# Patient Record
Sex: Male | Born: 1955 | Race: White | Hispanic: No | Marital: Married | Smoking: Former smoker
Health system: Southern US, Community
[De-identification: ages and names within clinical notes are randomized; demographics above are authoritative.]

## PROBLEM LIST (undated history)

## (undated) DIAGNOSIS — I700 Atherosclerosis of aorta: Secondary | ICD-10-CM

## (undated) DIAGNOSIS — K589 Irritable bowel syndrome without diarrhea: Secondary | ICD-10-CM

## (undated) DIAGNOSIS — C61 Malignant neoplasm of prostate: Secondary | ICD-10-CM

## (undated) DIAGNOSIS — K4090 Unilateral inguinal hernia, without obstruction or gangrene, not specified as recurrent: Secondary | ICD-10-CM

## (undated) DIAGNOSIS — K5790 Diverticulosis of intestine, part unspecified, without perforation or abscess without bleeding: Secondary | ICD-10-CM

## (undated) DIAGNOSIS — K219 Gastro-esophageal reflux disease without esophagitis: Secondary | ICD-10-CM

## (undated) DIAGNOSIS — R972 Elevated prostate specific antigen [PSA]: Secondary | ICD-10-CM

## (undated) DIAGNOSIS — R040 Epistaxis: Secondary | ICD-10-CM

## (undated) DIAGNOSIS — I10 Essential (primary) hypertension: Secondary | ICD-10-CM

## (undated) HISTORY — PX: COLONOSCOPY: SHX174

## (undated) HISTORY — PX: UPPER GI ENDOSCOPY: SHX6162

## (undated) HISTORY — PX: CATARACT EXTRACTION: SUR2

## (undated) HISTORY — PX: WISDOM TOOTH EXTRACTION: SHX21

---

## 2017-02-22 ENCOUNTER — Other Ambulatory Visit: Payer: Self-pay | Admitting: Gastroenterology

## 2017-02-22 DIAGNOSIS — R1012 Left upper quadrant pain: Secondary | ICD-10-CM

## 2017-02-24 ENCOUNTER — Ambulatory Visit
Admission: RE | Admit: 2017-02-24 | Discharge: 2017-02-24 | Disposition: A | Discharge status: Home / Self Care | Payer: Managed Care, Other (non HMO) | Source: Ambulatory Visit | Attending: Gastroenterology | Admitting: Gastroenterology

## 2017-02-24 DIAGNOSIS — I700 Atherosclerosis of aorta: Secondary | ICD-10-CM | POA: Insufficient documentation

## 2017-02-24 DIAGNOSIS — K5730 Diverticulosis of large intestine without perforation or abscess without bleeding: Secondary | ICD-10-CM | POA: Insufficient documentation

## 2017-02-24 DIAGNOSIS — R1012 Left upper quadrant pain: Secondary | ICD-10-CM

## 2017-02-24 MED ORDER — IOPAMIDOL (ISOVUE-300) INJECTION 61%
100.0000 mL | Freq: Once | INTRAVENOUS | Status: AC | PRN
  Administered 2017-02-24: 100 mL via INTRAVENOUS

## 2017-11-02 ENCOUNTER — Other Ambulatory Visit: Payer: Self-pay | Admitting: Urology

## 2017-11-02 DIAGNOSIS — C61 Malignant neoplasm of prostate: Secondary | ICD-10-CM

## 2017-11-17 ENCOUNTER — Encounter (HOSPITAL_COMMUNITY)
Admission: RE | Admit: 2017-11-17 | Discharge: 2017-11-17 | Disposition: A | Discharge status: Home / Self Care | Payer: Managed Care, Other (non HMO) | Source: Ambulatory Visit | Attending: Urology | Admitting: Urology

## 2017-11-17 DIAGNOSIS — C61 Malignant neoplasm of prostate: Secondary | ICD-10-CM | POA: Insufficient documentation

## 2017-11-17 MED ORDER — TECHNETIUM TC 99M MEDRONATE IV KIT
21.3000 | PACK | Freq: Once | INTRAVENOUS | Status: AC | PRN
  Administered 2017-11-17: 21.3 via INTRAVENOUS

## 2017-11-23 ENCOUNTER — Ambulatory Visit (HOSPITAL_COMMUNITY)
Admission: RE | Admit: 2017-11-23 | Discharge: 2017-11-23 | Disposition: A | Discharge status: Home / Self Care | Payer: Managed Care, Other (non HMO) | Source: Ambulatory Visit | Attending: Urology | Admitting: Urology

## 2017-11-23 ENCOUNTER — Other Ambulatory Visit: Payer: Self-pay | Admitting: Urology

## 2017-11-23 DIAGNOSIS — I700 Atherosclerosis of aorta: Secondary | ICD-10-CM | POA: Insufficient documentation

## 2017-11-23 DIAGNOSIS — C61 Malignant neoplasm of prostate: Secondary | ICD-10-CM | POA: Diagnosis not present

## 2017-12-26 ENCOUNTER — Encounter (HOSPITAL_COMMUNITY): Payer: Self-pay

## 2017-12-26 NOTE — Patient Instructions (Signed)
Your procedure is scheduled on: Monday, January 01, 2018   Surgery Time:  12:30PM-4:30PM   Report to [facility] Main  Entrance    Report to admitting at 10:30 AM   Call this number if you have problems the morning of surgery [phone]   Do not eat food or drink liquids :After Midnight.   Do NOT smoke after Midnight   Take these medicines the morning of surgery with A SIP OF WATER: Amlodipine                               You may not have any metal on your body including  jewelry, and body piercings             Do not wear lotions, powders, perfumes/cologne, or deodorant                          Men may shave face and neck.   Do not bring valuables to the hospital. [place].   Contacts, dentures or bridgework may not be worn into surgery.   Leave suitcase in the car. After surgery it may be brought to your room.   Special Instructions: Bring a copy of your healthcare power of attorney and living will documents         the day of surgery if you haven't scanned them in before.              Please read over the following fact sheets you were given:  [facility] - Preparing for Surgery Before surgery, you can play an important role.  Because skin is not sterile, your skin needs to be as free of germs as possible.  You can reduce the number of germs on your skin by washing with CHG (chlorahexidine gluconate) soap before surgery.  CHG is an antiseptic cleaner which kills germs and bonds with the skin to continue killing germs even after washing. Please DO NOT use if you have an allergy to CHG or antibacterial soaps.  If your skin becomes reddened/irritated stop using the CHG and inform your nurse when you arrive at Short Stay. Do not shave (including legs and underarms) for at least 48 hours prior to the first CHG shower.  You may shave your face/neck.  Please follow these instructions carefully:  1.  Shower with CHG  Soap the night before surgery and the  morning of surgery.  2.  If you choose to wash your hair, wash your hair first as usual with your normal  shampoo.  3.  After you shampoo, rinse your hair and body thoroughly to remove the shampoo.                             4.  Use CHG as you would any other liquid soap.  You can apply chg directly to the skin and wash.  Gently with a scrungie or clean washcloth.  5.  Apply the CHG Soap to your body ONLY FROM THE NECK DOWN.   Do   not use on face/ open                           Wound or open  sores. Avoid contact with eyes, ears mouth and   genitals (private parts).                       Wash face,  Genitals (private parts) with your normal soap.             6.  Wash thoroughly, paying special attention to the area where your    surgery  will be performed.  7.  Thoroughly rinse your body with warm water from the neck down.  8.  DO NOT shower/wash with your normal soap after using and rinsing off the CHG Soap.                9.  Pat yourself dry with a clean towel.            10.  Wear clean pajamas.            11.  Place clean sheets on your bed the night of your first shower and do not  sleep with pets. Day of Surgery : Do not apply any lotions/deodorants the morning of surgery.  Please wear clean clothes to the hospital/surgery center.  FAILURE TO FOLLOW THESE INSTRUCTIONS MAY RESULT IN THE CANCELLATION OF YOUR SURGERY  PATIENT SIGNATURE_________________________________  NURSE SIGNATURE__________________________________  ________________________________________________________________________  WHAT IS A BLOOD TRANSFUSION? Blood Transfusion Information  A transfusion is the replacement of blood or some of its parts. Blood is made up of multiple cells which provide different functions.  Red blood cells carry oxygen and are used for blood loss replacement.  White blood cells fight against infection.  Platelets control bleeding.  Plasma helps  clot blood.  Other blood products are available for specialized needs, such as hemophilia or other clotting disorders. BEFORE THE TRANSFUSION  Who gives blood for transfusions?   Healthy volunteers who are fully evaluated to make sure their blood is safe. This is blood bank blood. Transfusion therapy is the safest it has ever been in the practice of medicine. Before blood is taken from a donor, a complete history is taken to make sure that person has no history of diseases nor engages in risky social behavior (examples are intravenous drug use or sexual activity with multiple partners). The donor's travel history is screened to minimize risk of transmitting infections, such as malaria. The donated blood is tested for signs of infectious diseases, such as HIV and hepatitis. The blood is then tested to be sure it is compatible with you in order to minimize the chance of a transfusion reaction. If you or a relative donates blood, this is often done in anticipation of surgery and is not appropriate for emergency situations. It takes many days to process the donated blood. RISKS AND COMPLICATIONS Although transfusion therapy is very safe and saves many lives, the main dangers of transfusion include:   Getting an infectious disease.  Developing a transfusion reaction. This is an allergic reaction to something in the blood you were given. Every precaution is taken to prevent this. The decision to have a blood transfusion has been considered carefully by your caregiver before blood is given. Blood is not given unless the benefits outweigh the risks. AFTER THE TRANSFUSION  Right after receiving a blood transfusion, you will usually feel much better and more energetic. This is especially true if your red blood cells have gotten low (anemic). The transfusion raises the level of the red blood cells which carry oxygen, and this usually causes an energy increase.  The nurse administering the transfusion will  monitor you carefully for complications. HOME CARE INSTRUCTIONS  No special instructions are needed after a transfusion. You may find your energy is better. Speak with your caregiver about any limitations on activity for underlying diseases you may have. SEEK MEDICAL CARE IF:   Your condition is not improving after your transfusion.  You develop redness or irritation at the intravenous (IV) site. SEEK IMMEDIATE MEDICAL CARE IF:  Any of the following symptoms occur over the next 12 hours:  Shaking chills.  You have a temperature by mouth above 102 F (38.9 C), not controlled by medicine.  Chest, back, or muscle pain.  People around you feel you are not acting correctly or are confused.  Shortness of breath or difficulty breathing.  Dizziness and fainting.  You get a rash or develop hives.  You have a decrease in urine output.  Your urine turns a dark color or changes to pink, red, or brown. Any of the following symptoms occur over the next 10 days:  You have a temperature by mouth above 102 F (38.9 C), not controlled by medicine.  Shortness of breath.  Weakness after normal activity.  The white part of the eye turns yellow (jaundice).  You have a decrease in the amount of urine or are urinating less often.  Your urine turns a dark color or changes to pink, red, or brown. Document Released: 09/23/2000 Document Revised: 12/19/2011 Document Reviewed: 05/12/2008 [facility] Patient Information 2014 [place], [place].  _______________________________________________________________________

## 2017-12-26 NOTE — Pre-Procedure Instructions (Signed)
Last office note from Dr. Heber [place] in epic. EKG 11/01/2017 in patient's hard chart

## 2017-12-27 ENCOUNTER — Encounter (HOSPITAL_COMMUNITY)
Admission: RE | Admit: 2017-12-27 | Discharge: 2017-12-27 | Disposition: A | Discharge status: Home / Self Care | Payer: Managed Care, Other (non HMO) | Source: Ambulatory Visit | Attending: Urology | Admitting: Urology

## 2017-12-27 ENCOUNTER — Other Ambulatory Visit: Payer: Self-pay

## 2017-12-27 ENCOUNTER — Encounter (HOSPITAL_COMMUNITY): Payer: Self-pay

## 2017-12-27 DIAGNOSIS — Z01812 Encounter for preprocedural laboratory examination: Secondary | ICD-10-CM | POA: Diagnosis present

## 2017-12-27 DIAGNOSIS — C61 Malignant neoplasm of prostate: Secondary | ICD-10-CM | POA: Diagnosis not present

## 2017-12-27 HISTORY — DX: Diverticulosis of intestine, part unspecified, without perforation or abscess without bleeding: K57.90

## 2017-12-27 HISTORY — DX: Malignant neoplasm of prostate: C61

## 2017-12-27 HISTORY — DX: Atherosclerosis of aorta: I70.0

## 2017-12-27 HISTORY — DX: Essential (primary) hypertension: I10

## 2017-12-27 HISTORY — DX: Epistaxis: R04.0

## 2017-12-27 HISTORY — DX: Unilateral inguinal hernia, without obstruction or gangrene, not specified as recurrent: K40.90

## 2017-12-27 LAB — PROTIME-INR
INR: 0.88
Prothrombin Time: 11.8 seconds (ref 11.4–15.2)

## 2017-12-27 LAB — BASIC METABOLIC PANEL
Anion gap: 12 (ref 5–15)
BUN: 5 mg/dL — ABNORMAL LOW (ref 6–20)
CHLORIDE: 102 mmol/L (ref 101–111)
CO2: 24 mmol/L (ref 22–32)
CREATININE: 0.6 mg/dL — AB (ref 0.61–1.24)
Calcium: 9.1 mg/dL (ref 8.9–10.3)
GFR calc Af Amer: >60 mL/min (ref >60)
GFR calc non Af Amer: >60 mL/min (ref >60)
Glucose, Bld: 114 mg/dL — ABNORMAL HIGH (ref 65–99)
POTASSIUM: 3 mmol/L — AB (ref 3.5–5.1)
Sodium: 138 mmol/L (ref 135–145)

## 2017-12-27 LAB — CBC
HEMATOCRIT: 38.6 % — AB (ref 39.0–52.0)
HEMOGLOBIN: 13.3 g/dL (ref 13.0–17.0)
MCH: 34.9 pg — AB (ref 26.0–34.0)
MCHC: 34.5 g/dL (ref 30.0–36.0)
MCV: 101.3 fL — AB (ref 78.0–100.0)
PLATELETS: 278 10*3/uL (ref 150–400)
RBC: 3.81 MIL/uL — AB (ref 4.22–5.81)
RDW: 14.4 % (ref 11.5–15.5)
WBC: 11.1 10*3/uL — ABNORMAL HIGH (ref 4.0–10.5)

## 2017-12-27 LAB — ABO/RH: ABO/RH(D): A POS

## 2017-12-27 NOTE — Pre-Procedure Instructions (Addendum)
Dr. Ola Spurr made aware of potassium 3.0 on 12/27/2017, no new orders received at this time.  CBC and BMP results 12/27/2017 faxed to Dr. Gloriann Loan via epic.

## 2017-12-27 NOTE — Patient Instructions (Signed)
Your procedure is scheduled on: Monday, January 01, 2018   Surgery Time:  12:30PM-4:30PM   Report to [facility] Main  Entrance    Report to admitting at 10:30 AM   Call this number if you have problems the morning of surgery [phone]   Do not eat food or drink liquids :After Midnight.   Do NOT smoke after Midnight   Take these medicines the morning of surgery with A SIP OF WATER: Amlodipine                               You may not have any metal on your body including  jewelry, and body piercings             Do not wear  lotions, powders, perfumes/cologne, or deodorant                          Men may shave face and neck.   Do not bring valuables to the hospital. [place].   Contacts, dentures or bridgework may not be worn into surgery.   Leave suitcase in the car. After surgery it may be brought to your room.   Special Instructions: Bring a copy of your healthcare power of attorney and living will documents         the day of surgery if you haven't scanned them in before.              Please read over the following fact sheets you were given:  [facility] - Preparing for Surgery Before surgery, you can play an important role.  Because skin is not sterile, your skin needs to be as free of germs as possible.  You can reduce the number of germs on your skin by washing with CHG (chlorahexidine gluconate) soap before surgery.  CHG is an antiseptic cleaner which kills germs and bonds with the skin to continue killing germs even after washing. Please DO NOT use if you have an allergy to CHG or antibacterial soaps.  If your skin becomes reddened/irritated stop using the CHG and inform your nurse when you arrive at Short Stay. Do not shave (including legs and underarms) for at least 48 hours prior to the first CHG shower.  You may shave your face/neck.  Please follow these instructions carefully:  1.  Shower with CHG  Soap the night before surgery and the  morning of surgery.  2.  If you choose to wash your hair, wash your hair first as usual with your normal  shampoo.  3.  After you shampoo, rinse your hair and body thoroughly to remove the shampoo.                             4.  Use CHG as you would any other liquid soap.  You can apply chg directly to the skin and wash.  Gently with a scrungie or clean washcloth.  5.  Apply the CHG Soap to your body ONLY FROM THE NECK DOWN.   Do   not use on face/ open                           Wound or  open sores. Avoid contact with eyes, ears mouth and   genitals (private parts).                       Wash face,  Genitals (private parts) with your normal soap.             6.  Wash thoroughly, paying special attention to the area where your    surgery  will be performed.  7.  Thoroughly rinse your body with warm water from the neck down.  8.  DO NOT shower/wash with your normal soap after using and rinsing off the CHG Soap.                9.  Pat yourself dry with a clean towel.            10.  Wear clean pajamas.            11.  Place clean sheets on your bed the night of your first shower and do not  sleep with pets. Day of Surgery : Do not apply any lotions/deodorants the morning of surgery.  Please wear clean clothes to the hospital/surgery center.  FAILURE TO FOLLOW THESE INSTRUCTIONS MAY RESULT IN THE CANCELLATION OF YOUR SURGERY  PATIENT SIGNATURE_________________________________  NURSE SIGNATURE__________________________________  ________________________________________________________________________  WHAT IS A BLOOD TRANSFUSION? Blood Transfusion Information  A transfusion is the replacement of blood or some of its parts. Blood is made up of multiple cells which provide different functions.  Red blood cells carry oxygen and are used for blood loss replacement.  White blood cells fight against infection.  Platelets control bleeding.  Plasma helps  clot blood.  Other blood products are available for specialized needs, such as hemophilia or other clotting disorders. BEFORE THE TRANSFUSION  Who gives blood for transfusions?   Healthy volunteers who are fully evaluated to make sure their blood is safe. This is blood bank blood. Transfusion therapy is the safest it has ever been in the practice of medicine. Before blood is taken from a donor, a complete history is taken to make sure that person has no history of diseases nor engages in risky social behavior (examples are intravenous drug use or sexual activity with multiple partners). The donor's travel history is screened to minimize risk of transmitting infections, such as malaria. The donated blood is tested for signs of infectious diseases, such as HIV and hepatitis. The blood is then tested to be sure it is compatible with you in order to minimize the chance of a transfusion reaction. If you or a relative donates blood, this is often done in anticipation of surgery and is not appropriate for emergency situations. It takes many days to process the donated blood. RISKS AND COMPLICATIONS Although transfusion therapy is very safe and saves many lives, the main dangers of transfusion include:   Getting an infectious disease.  Developing a transfusion reaction. This is an allergic reaction to something in the blood you were given. Every precaution is taken to prevent this. The decision to have a blood transfusion has been considered carefully by your caregiver before blood is given. Blood is not given unless the benefits outweigh the risks. AFTER THE TRANSFUSION  Right after receiving a blood transfusion, you will usually feel much better and more energetic. This is especially true if your red blood cells have gotten low (anemic). The transfusion raises the level of the red blood cells which carry oxygen, and this usually causes an energy  increase.  The nurse administering the transfusion will  monitor you carefully for complications. HOME CARE INSTRUCTIONS  No special instructions are needed after a transfusion. You may find your energy is better. Speak with your caregiver about any limitations on activity for underlying diseases you may have. SEEK MEDICAL CARE IF:   Your condition is not improving after your transfusion.  You develop redness or irritation at the intravenous (IV) site. SEEK IMMEDIATE MEDICAL CARE IF:  Any of the following symptoms occur over the next 12 hours:  Shaking chills.  You have a temperature by mouth above 102 F (38.9 C), not controlled by medicine.  Chest, back, or muscle pain.  People around you feel you are not acting correctly or are confused.  Shortness of breath or difficulty breathing.  Dizziness and fainting.  You get a rash or develop hives.  You have a decrease in urine output.  Your urine turns a dark color or changes to pink, red, or brown. Any of the following symptoms occur over the next 10 days:  You have a temperature by mouth above 102 F (38.9 C), not controlled by medicine.  Shortness of breath.  Weakness after normal activity.  The white part of the eye turns yellow (jaundice).  You have a decrease in the amount of urine or are urinating less often.  Your urine turns a dark color or changes to pink, red, or brown. Document Released: 09/23/2000 Document Revised: 12/19/2011 Document Reviewed: 05/12/2008 [facility] Patient Information 2014 [place], [place].  _______________________________________________________________________

## 2018-01-01 ENCOUNTER — Encounter (HOSPITAL_COMMUNITY)
Admission: RE | Disposition: A | Discharge status: Home / Self Care | Payer: Self-pay | Source: Ambulatory Visit | Attending: Urology

## 2018-01-01 ENCOUNTER — Encounter (HOSPITAL_COMMUNITY): Payer: Self-pay | Admitting: Certified Registered Nurse Anesthetist

## 2018-01-01 ENCOUNTER — Observation Stay (HOSPITAL_COMMUNITY)
Admission: RE | Admit: 2018-01-01 | Discharge: 2018-01-02 | Disposition: A | Discharge status: Home / Self Care | Payer: Managed Care, Other (non HMO) | Source: Ambulatory Visit | Attending: Urology | Admitting: Urology

## 2018-01-01 ENCOUNTER — Ambulatory Visit (HOSPITAL_COMMUNITY): Payer: Managed Care, Other (non HMO) | Admitting: Anesthesiology

## 2018-01-01 ENCOUNTER — Other Ambulatory Visit: Payer: Self-pay

## 2018-01-01 DIAGNOSIS — I700 Atherosclerosis of aorta: Secondary | ICD-10-CM | POA: Diagnosis not present

## 2018-01-01 DIAGNOSIS — Z8719 Personal history of other diseases of the digestive system: Secondary | ICD-10-CM | POA: Insufficient documentation

## 2018-01-01 DIAGNOSIS — Z79899 Other long term (current) drug therapy: Secondary | ICD-10-CM | POA: Insufficient documentation

## 2018-01-01 DIAGNOSIS — K589 Irritable bowel syndrome without diarrhea: Secondary | ICD-10-CM | POA: Insufficient documentation

## 2018-01-01 DIAGNOSIS — Z888 Allergy status to other drugs, medicaments and biological substances status: Secondary | ICD-10-CM | POA: Insufficient documentation

## 2018-01-01 DIAGNOSIS — C61 Malignant neoplasm of prostate: Secondary | ICD-10-CM | POA: Diagnosis not present

## 2018-01-01 DIAGNOSIS — I10 Essential (primary) hypertension: Secondary | ICD-10-CM | POA: Insufficient documentation

## 2018-01-01 DIAGNOSIS — F17210 Nicotine dependence, cigarettes, uncomplicated: Secondary | ICD-10-CM | POA: Diagnosis not present

## 2018-01-01 DIAGNOSIS — K219 Gastro-esophageal reflux disease without esophagitis: Secondary | ICD-10-CM | POA: Insufficient documentation

## 2018-01-01 HISTORY — DX: Irritable bowel syndrome without diarrhea: K58.9

## 2018-01-01 HISTORY — DX: Irritable bowel syndrome, unspecified: K58.9

## 2018-01-01 HISTORY — PX: ROBOT ASSISTED LAPAROSCOPIC RADICAL PROSTATECTOMY: SHX5141

## 2018-01-01 HISTORY — DX: Gastro-esophageal reflux disease without esophagitis: K21.9

## 2018-01-01 HISTORY — PX: LYMPHADENECTOMY: SHX5960

## 2018-01-01 LAB — CBC
HCT: 39.6 % (ref 39.0–52.0)
HEMOGLOBIN: 13.2 g/dL (ref 13.0–17.0)
MCH: 35.2 pg — AB (ref 26.0–34.0)
MCHC: 33.3 g/dL (ref 30.0–36.0)
MCV: 105.6 fL — ABNORMAL HIGH (ref 78.0–100.0)
PLATELETS: 302 10*3/uL (ref 150–400)
RBC: 3.75 MIL/uL — AB (ref 4.22–5.81)
RDW: 14.8 % (ref 11.5–15.5)
WBC: 16.5 10*3/uL — ABNORMAL HIGH (ref 4.0–10.5)

## 2018-01-01 LAB — TYPE AND SCREEN
ABO/RH(D): A POS
ANTIBODY SCREEN: NEGATIVE

## 2018-01-01 LAB — CREATININE, SERUM
CREATININE: 0.69 mg/dL (ref 0.61–1.24)
GFR calc Af Amer: >60 mL/min (ref >60)

## 2018-01-01 SURGERY — PROSTATECTOMY, RADICAL, ROBOT-ASSISTED, LAPAROSCOPIC
Anesthesia: General

## 2018-01-01 MED ORDER — MIDAZOLAM HCL 5 MG/5ML IJ SOLN
INTRAMUSCULAR | Status: DC | PRN
  Administered 2018-01-01: 2 mg via INTRAVENOUS

## 2018-01-01 MED ORDER — ACETAMINOPHEN 325 MG PO TABS: 650.0000 mg | ORAL_TABLET | ORAL | Status: DC | PRN

## 2018-01-01 MED ORDER — ROCURONIUM BROMIDE 10 MG/ML (PF) SYRINGE
PREFILLED_SYRINGE | INTRAVENOUS | Status: AC
  Filled 2018-01-01: qty 5

## 2018-01-01 MED ORDER — OXYCODONE HCL 5 MG PO TABS: 5.0000 mg | ORAL_TABLET | Freq: Once | ORAL | Status: DC | PRN

## 2018-01-01 MED ORDER — HYDROMORPHONE HCL 1 MG/ML IJ SOLN
INTRAMUSCULAR | Status: AC
  Filled 2018-01-01: qty 1

## 2018-01-01 MED ORDER — ACETAMINOPHEN 10 MG/ML IV SOLN
INTRAVENOUS | Status: AC
  Filled 2018-01-01: qty 100

## 2018-01-01 MED ORDER — HYDROMORPHONE HCL 1 MG/ML IJ SOLN
0.5000 mg | INTRAMUSCULAR | Status: DC | PRN
  Administered 2018-01-01: 0.5 mg via INTRAVENOUS
  Administered 2018-01-02: 1 mg via INTRAVENOUS
  Filled 2018-01-01 (×2): qty 1

## 2018-01-01 MED ORDER — FENTANYL CITRATE (PF) 100 MCG/2ML IJ SOLN
INTRAMUSCULAR | Status: AC
  Filled 2018-01-01: qty 2

## 2018-01-01 MED ORDER — PROPOFOL 10 MG/ML IV BOLUS
INTRAVENOUS | Status: DC | PRN
  Administered 2018-01-01: 150 mg via INTRAVENOUS

## 2018-01-01 MED ORDER — ONDANSETRON HCL 4 MG/2ML IJ SOLN
INTRAMUSCULAR | Status: AC
  Filled 2018-01-01: qty 2

## 2018-01-01 MED ORDER — BUPIVACAINE LIPOSOME 1.3 % IJ SUSP
20.0000 mL | Freq: Once | INTRAMUSCULAR | Status: AC
  Administered 2018-01-01: 20 mL
  Filled 2018-01-01: qty 20

## 2018-01-01 MED ORDER — CEFAZOLIN SODIUM-DEXTROSE 2-4 GM/100ML-% IV SOLN
2.0000 g | INTRAVENOUS | Status: AC
  Administered 2018-01-01: 2 g via INTRAVENOUS
  Filled 2018-01-01: qty 100

## 2018-01-01 MED ORDER — ACETAMINOPHEN 10 MG/ML IV SOLN
1000.0000 mg | Freq: Once | INTRAVENOUS | Status: AC
  Administered 2018-01-01: 1000 mg via INTRAVENOUS

## 2018-01-01 MED ORDER — OXYCODONE HCL 5 MG/5ML PO SOLN
5.0000 mg | Freq: Once | ORAL | Status: DC | PRN
  Filled 2018-01-01: qty 5

## 2018-01-01 MED ORDER — ONDANSETRON HCL 4 MG/2ML IJ SOLN: 4.0000 mg | INTRAMUSCULAR | Status: DC | PRN

## 2018-01-01 MED ORDER — MIDAZOLAM HCL 2 MG/2ML IJ SOLN
INTRAMUSCULAR | Status: AC
  Filled 2018-01-01: qty 2

## 2018-01-01 MED ORDER — AMLODIPINE BESYLATE 5 MG PO TABS
5.0000 mg | ORAL_TABLET | Freq: Every day | ORAL | Status: DC
  Administered 2018-01-01 – 2018-01-02 (×2): 5 mg via ORAL
  Filled 2018-01-01 (×2): qty 1

## 2018-01-01 MED ORDER — DEXTROSE-NACL 5-0.45 % IV SOLN
INTRAVENOUS | Status: DC
  Administered 2018-01-01 – 2018-01-02 (×2): via INTRAVENOUS

## 2018-01-01 MED ORDER — LACTATED RINGERS IR SOLN
Status: DC | PRN
  Administered 2018-01-01: 1

## 2018-01-01 MED ORDER — SODIUM CHLORIDE 0.9 % IV BOLUS: 1000.0000 mL | Freq: Once | INTRAVENOUS | Status: DC

## 2018-01-01 MED ORDER — ONDANSETRON HCL 4 MG/2ML IJ SOLN
INTRAMUSCULAR | Status: DC | PRN
  Administered 2018-01-01: 4 mg via INTRAVENOUS

## 2018-01-01 MED ORDER — LIDOCAINE 2% (20 MG/ML) 5 ML SYRINGE
INTRAMUSCULAR | Status: DC | PRN
  Administered 2018-01-01: 60 mg via INTRAVENOUS

## 2018-01-01 MED ORDER — DIPHENHYDRAMINE HCL 50 MG/ML IJ SOLN: 12.5000 mg | Freq: Four times a day (QID) | INTRAMUSCULAR | Status: DC | PRN

## 2018-01-01 MED ORDER — ACETAMINOPHEN 325 MG PO TABS: 325.0000 mg | ORAL_TABLET | ORAL | Status: DC | PRN

## 2018-01-01 MED ORDER — SUGAMMADEX SODIUM 200 MG/2ML IV SOLN
INTRAVENOUS | Status: DC | PRN
  Administered 2018-01-01: 200 mg via INTRAVENOUS

## 2018-01-01 MED ORDER — ENOXAPARIN SODIUM 40 MG/0.4ML ~~LOC~~ SOLN
40.0000 mg | SUBCUTANEOUS | Status: DC
  Administered 2018-01-02: 40 mg via SUBCUTANEOUS
  Filled 2018-01-01: qty 0.4

## 2018-01-01 MED ORDER — ONDANSETRON HCL 4 MG/2ML IJ SOLN: 4.0000 mg | Freq: Once | INTRAMUSCULAR | Status: DC | PRN

## 2018-01-01 MED ORDER — LIDOCAINE 2% (20 MG/ML) 5 ML SYRINGE
INTRAMUSCULAR | Status: AC
  Filled 2018-01-01: qty 5

## 2018-01-01 MED ORDER — EPHEDRINE SULFATE-NACL 50-0.9 MG/10ML-% IV SOSY
PREFILLED_SYRINGE | INTRAVENOUS | Status: DC | PRN
  Administered 2018-01-01 (×4): 5 mg via INTRAVENOUS

## 2018-01-01 MED ORDER — FENTANYL CITRATE (PF) 250 MCG/5ML IJ SOLN
INTRAMUSCULAR | Status: AC
  Filled 2018-01-01: qty 5

## 2018-01-01 MED ORDER — HYDROMORPHONE HCL 1 MG/ML IJ SOLN
0.2500 mg | INTRAMUSCULAR | Status: DC | PRN
  Administered 2018-01-01: 0.5 mg via INTRAVENOUS

## 2018-01-01 MED ORDER — SUGAMMADEX SODIUM 200 MG/2ML IV SOLN
INTRAVENOUS | Status: AC
  Filled 2018-01-01: qty 2

## 2018-01-01 MED ORDER — ACETAMINOPHEN 160 MG/5ML PO SOLN: 325.0000 mg | ORAL | Status: DC | PRN

## 2018-01-01 MED ORDER — SODIUM CHLORIDE 0.9 % IJ SOLN
INTRAMUSCULAR | Status: DC | PRN
  Administered 2018-01-01 (×2): 20 mL

## 2018-01-01 MED ORDER — FENTANYL CITRATE (PF) 100 MCG/2ML IJ SOLN
INTRAMUSCULAR | Status: DC | PRN
  Administered 2018-01-01: 100 ug via INTRAVENOUS
  Administered 2018-01-01 (×3): 50 ug via INTRAVENOUS
  Administered 2018-01-01 (×2): 100 ug via INTRAVENOUS

## 2018-01-01 MED ORDER — ROCURONIUM BROMIDE 10 MG/ML (PF) SYRINGE
PREFILLED_SYRINGE | INTRAVENOUS | Status: DC | PRN
  Administered 2018-01-01: 10 mg via INTRAVENOUS
  Administered 2018-01-01: 20 mg via INTRAVENOUS
  Administered 2018-01-01: 60 mg via INTRAVENOUS

## 2018-01-01 MED ORDER — LISINOPRIL 10 MG PO TABS
10.0000 mg | ORAL_TABLET | Freq: Every day | ORAL | Status: DC
  Administered 2018-01-01 – 2018-01-02 (×2): 10 mg via ORAL
  Filled 2018-01-01 (×2): qty 1

## 2018-01-01 MED ORDER — HYDROCODONE-ACETAMINOPHEN 5-325 MG PO TABS: 1.0000 | ORAL_TABLET | Freq: Four times a day (QID) | ORAL | 0 refills | Status: DC | PRN

## 2018-01-01 MED ORDER — PANTOPRAZOLE SODIUM 40 MG PO TBEC
80.0000 mg | DELAYED_RELEASE_TABLET | Freq: Every day | ORAL | Status: DC
  Administered 2018-01-02: 80 mg via ORAL
  Filled 2018-01-01: qty 2

## 2018-01-01 MED ORDER — DIPHENHYDRAMINE HCL 12.5 MG/5ML PO ELIX: 12.5000 mg | ORAL_SOLUTION | Freq: Four times a day (QID) | ORAL | Status: DC | PRN

## 2018-01-01 MED ORDER — LIP MEDEX EX OINT
TOPICAL_OINTMENT | CUTANEOUS | Status: AC
  Filled 2018-01-01: qty 7

## 2018-01-01 MED ORDER — KETOROLAC TROMETHAMINE 30 MG/ML IJ SOLN
INTRAMUSCULAR | Status: AC
  Filled 2018-01-01: qty 1

## 2018-01-01 MED ORDER — DEXAMETHASONE SODIUM PHOSPHATE 10 MG/ML IJ SOLN
INTRAMUSCULAR | Status: AC
  Filled 2018-01-01: qty 1

## 2018-01-01 MED ORDER — KETOROLAC TROMETHAMINE 30 MG/ML IJ SOLN
30.0000 mg | Freq: Once | INTRAMUSCULAR | Status: DC | PRN
  Administered 2018-01-01: 30 mg via INTRAVENOUS

## 2018-01-01 MED ORDER — SODIUM CHLORIDE 0.9 % IJ SOLN
INTRAMUSCULAR | Status: AC
  Filled 2018-01-01: qty 20

## 2018-01-01 MED ORDER — MEPERIDINE HCL 50 MG/ML IJ SOLN: 6.2500 mg | INTRAMUSCULAR | Status: DC | PRN

## 2018-01-01 MED ORDER — STERILE WATER FOR IRRIGATION IR SOLN
Status: DC | PRN
  Administered 2018-01-01: 1000 mL

## 2018-01-01 MED ORDER — FENTANYL CITRATE (PF) 100 MCG/2ML IJ SOLN
25.0000 ug | INTRAMUSCULAR | Status: DC | PRN
  Administered 2018-01-01 (×2): 25 ug via INTRAVENOUS

## 2018-01-01 MED ORDER — LACTATED RINGERS IV SOLN
INTRAVENOUS | Status: DC
  Administered 2018-01-01 (×2): via INTRAVENOUS

## 2018-01-01 MED ORDER — OXYCODONE HCL 5 MG PO TABS
5.0000 mg | ORAL_TABLET | ORAL | Status: DC | PRN
  Administered 2018-01-02 (×2): 5 mg via ORAL
  Filled 2018-01-01 (×2): qty 1

## 2018-01-01 MED ORDER — DEXAMETHASONE SODIUM PHOSPHATE 4 MG/ML IJ SOLN
INTRAMUSCULAR | Status: DC | PRN
  Administered 2018-01-01: 5 mg via INTRAVENOUS

## 2018-01-01 SURGICAL SUPPLY — 61 items
APPLICATOR COTTON TIP 6IN STRL (MISCELLANEOUS) ×4 IMPLANT
APPLICATOR SURGIFLO ENDO (HEMOSTASIS) ×4 IMPLANT
CATH FOLEY 2WAY SLVR  5CC 18FR (CATHETERS) ×2
CATH FOLEY 2WAY SLVR 5CC 18FR (CATHETERS) ×2 IMPLANT
CATH ROBINSON RED A/P 8FR (CATHETERS) IMPLANT
CATH TIEMANN FOLEY 18FR 5CC (CATHETERS) ×4 IMPLANT
CHLORAPREP W/TINT 26ML (MISCELLANEOUS) ×4 IMPLANT
CLIP VESOLOCK LG 6/CT PURPLE (CLIP) ×12 IMPLANT
COVER SURGICAL LIGHT HANDLE (MISCELLANEOUS) ×4 IMPLANT
COVER TIP SHEARS 8 DVNC (MISCELLANEOUS) ×4 IMPLANT
COVER TIP SHEARS 8MM DA VINCI (MISCELLANEOUS) ×4
CUTTER ECHEON FLEX ENDO 45 340 (ENDOMECHANICALS) ×4 IMPLANT
DECANTER SPIKE VIAL GLASS SM (MISCELLANEOUS) ×4 IMPLANT
DERMABOND ADVANCED (GAUZE/BANDAGES/DRESSINGS) ×2
DERMABOND ADVANCED .7 DNX12 (GAUZE/BANDAGES/DRESSINGS) ×2 IMPLANT
DRAPE ARM DVNC X/XI (DISPOSABLE) ×8 IMPLANT
DRAPE COLUMN DVNC XI (DISPOSABLE) ×2 IMPLANT
DRAPE DA VINCI XI ARM (DISPOSABLE) ×8
DRAPE DA VINCI XI COLUMN (DISPOSABLE) ×2
DRAPE SURG IRRIG POUCH 19X23 (DRAPES) ×4 IMPLANT
DRSG TEGADERM 4X4.75 (GAUZE/BANDAGES/DRESSINGS) ×4 IMPLANT
ELECT REM PT RETURN 15FT ADLT (MISCELLANEOUS) ×4 IMPLANT
FLOSEAL 10ML (HEMOSTASIS) ×4 IMPLANT
GAUZE SPONGE 2X2 8PLY STRL LF (GAUZE/BANDAGES/DRESSINGS) ×2 IMPLANT
GLOVE BIO SURGEON STRL SZ 6.5 (GLOVE) ×3 IMPLANT
GLOVE BIO SURGEONS STRL SZ 6.5 (GLOVE) ×1
GLOVE BIOGEL M STRL SZ7.5 (GLOVE) ×8 IMPLANT
GOWN STRL REUS W/TWL LRG LVL3 (GOWN DISPOSABLE) ×12 IMPLANT
HEMOSTAT SURGICEL 4X8 (HEMOSTASIS) IMPLANT
HOLDER FOLEY CATH W/STRAP (MISCELLANEOUS) ×4 IMPLANT
IRRIG SUCT STRYKERFLOW 2 WTIP (MISCELLANEOUS) ×4
IRRIGATION SUCT STRKRFLW 2 WTP (MISCELLANEOUS) ×2 IMPLANT
IV LACTATED RINGERS 1000ML (IV SOLUTION) ×4 IMPLANT
NDL SAFETY ECLIPSE 18X1.5 (NEEDLE) IMPLANT
NEEDLE HYPO 18GX1.5 SHARP (NEEDLE)
NEEDLE INSUFFLATION 14GA 120MM (NEEDLE) ×4 IMPLANT
PACK ROBOT UROLOGY CUSTOM (CUSTOM PROCEDURE TRAY) ×4 IMPLANT
PAD POSITIONING PINK XL (MISCELLANEOUS) ×4 IMPLANT
PORT ACCESS TROCAR AIRSEAL 12 (TROCAR) ×2 IMPLANT
PORT ACCESS TROCAR AIRSEAL 5M (TROCAR) ×2
SEAL CANN UNIV 5-8 DVNC XI (MISCELLANEOUS) ×8 IMPLANT
SEAL XI 5MM-8MM UNIVERSAL (MISCELLANEOUS) ×8
SET TRI-LUMEN FLTR TB AIRSEAL (TUBING) ×4 IMPLANT
SOLUTION ELECTROLUBE (MISCELLANEOUS) ×4 IMPLANT
SPONGE GAUZE 2X2 STER 10/PKG (GAUZE/BANDAGES/DRESSINGS) ×2
SPONGE LAP 4X18 X RAY DECT (DISPOSABLE) ×4 IMPLANT
STAPLE RELOAD 45 GRN (STAPLE) ×2 IMPLANT
STAPLE RELOAD 45MM GREEN (STAPLE) ×2
SUT ETHILON 3 0 PS 1 (SUTURE) ×4 IMPLANT
SUT MNCRL AB 4-0 PS2 18 (SUTURE) ×8 IMPLANT
SUT VIC AB 0 UR5 27 (SUTURE) ×4 IMPLANT
SUT VIC AB 2-0 SH 27 (SUTURE) ×2
SUT VIC AB 2-0 SH 27X BRD (SUTURE) ×2 IMPLANT
SUT VICRYL 0 UR6 27IN ABS (SUTURE) ×8 IMPLANT
SUT VLOC BARB 180 ABS3/0GR12 (SUTURE) ×8
SUTURE VLOC BRB 180 ABS3/0GR12 (SUTURE) ×4 IMPLANT
SYR 27GX1/2 1ML LL SAFETY (SYRINGE) ×4 IMPLANT
TOWEL OR 17X26 10 PK STRL BLUE (TOWEL DISPOSABLE) ×4 IMPLANT
TOWEL OR NON WOVEN STRL DISP B (DISPOSABLE) ×4 IMPLANT
TUBING INSUFFLATION 10FT LAP (TUBING) IMPLANT
WATER STERILE IRR 1000ML POUR (IV SOLUTION) ×4 IMPLANT

## 2018-01-01 NOTE — Op Note (Signed)
Operative Note  Preoperative diagnosis:  1.  Prostate cancer   Postoperative diagnosis: 1.  Prostate cancer  Procedure(s): 1.  Robotic assisted laparoscopic prostatectomy with bilateral pelvic lymph node dissection  Surgeon: Link Snuffer, MD  Assistants: Rockne Menghini assistant was needed due to the nature of the case being a robotic surgery requiring a bedside assistant for retraction, suctioning, exchanging instruments, etc.   Resident assistant: Jess Barters, MD  Anesthesia: General   Complications: None   EBL: 100 cc   Specimens: 1. prostate and seminal vesicles 2.  Periprosthetic fat 3.  Right pelvic lymph node packet 4.  Left pelvic lymph node packet  Drains/Catheters: 1. 6 French Foley catheter and JP drain   Intraoperative findings: prostate and seminal vesicles were removed en bloc Without any evidence of gross extraprostatic disease  Indication: 62 year old male recently diagnosed with Gleason 4+4 adenocarcinoma of the prostate.  He underwent a metastatic workup which was negative.  After discussion of different options, he elected to undergo the above operation.  Description of procedure:  The patient was identified and consent was obtained.  The patient was taken to the operating room and placed in the supine position.  The patient was placed under general anesthesia.  Perioperative antibiotics were administered.  The patient was placed in dorsal lithotomy.  Patient was prepped and draped in a standard sterile fashion and a timeout was performed.  The patient was placed in steep Trendelenburg.  Foley catheter was placed.  Veress needle was inserted supraumbilical and the drop test was performed with no evidence of any injury.  The abdomen was then insufflated to a pressure of 15.  4 robotic working ports, a 12 mm assistant port, and a 5 mm assistant port were placed under direct visualization in a standard fashion for a robotic pelvic surgery.  The  abdomen was inspected and there was no evidence of any visceral or vascular injury.  I first released colonic adhesions in the left lower quadrant.  I then retracted the sigmoid and rectum superiorly.  I first started with the posterior dissection by incising along the posterior peritoneum and identifying bilateral vas deferens.  These were divided and bilateral seminal vesicles were carefully dissected out.  Denonvier fascia was then entered posteriorly.  The bladder was then dropped by incising along the medial umbilical ligament bilaterally.  Periprosthetic fat was cleared off the prostate and passed off and passed off for specimen.  The endopelvic fascia was incised bilaterally and the puboprostatic ligaments were released.  A stapler with a vascular staple load was used to staple the dorsal venous complex.  I then incised along into the bladder neck and divided the bladder neck.  The catheter was brought out and used for retraction.  I divided the remainder of the bladder neck and then pulled the seminal vesicles out of this incision.  Careful dissection was performed and bilateral prostatic pedicles were released using Hem-o-lok clips and sharp dissection.  Spot cautery was sparingly used.  Periprosthetic tissue was carefully released inferiorly and laterally, performing a nerve sparing operation on the left, but a wide resection of the prostate was performed on the right considering this is where his palpable disease as well as proven high-grade disease was located.  Once only the urethra remained attached, the anterior portion of the urethra was divided.  A 2-0 Vicryl stitch was placed at the 6 o'clock position.  The remainder of the urethra was divided and the prostate was placed in a specimen bag.  FloSeal was applied to the prostatectomy bed.  I then performed the bilateral pelvic lymph node dissection.  I first carefully removed lymph node packet from the right side overlying the external iliac  artery and vein down to the level of the obturator nerve.  This was passed off for specimen and then I carefully removed the left sided pelvic lymph node packets again overlying the external iliac artery and vein down to the level of the obturator nerve taking care not to injure any vital structures.  The 2-0 Vicryl stitch was used to reapproximate the bladder and urethra at the 6 o'clock position.  A running 3-0 V lock suture was then used to reapproximate the bladder and urethra.  The bladder was filled with normal saline and there was no evidence of any leak at the anastomosis.  The anastomosis was watertight.  The Foley catheter was exchanged for a fresh catheter.  A JP drain was inserted from the left lateral port site.  This was secured down with a nylon stitch.  The robot was undocked and the patient was taken out of Trendelenburg.  All working ports were removed under direct visualization with the camera to ensure there was no bleeding from the sites.  The midline port incision was extended and the prostate extracted in the specimen bag.  Interrupted figure-of-eight 0 Vicryl sutures were used to close  the fascia.  The 12 mm assistant port fascia was also closed with a 2-0 Vicryl.  Skin was then closed with 4-0 Monocryl and Dermabond.  Exparel was used for anesthetic effect.  This concluded the operation.  The patient tolerated procedure well and was stable postoperatively.  Plan: Will obtain stat labs.  He will remain in the hospital overnight and hopefully be able to be discharged tomorrow.  He will keep his catheter for 7-10 days.

## 2018-01-01 NOTE — Interval H&P Note (Signed)
History and Physical Interval Note:  01/01/2018 11:54 AM  Jesse Lamb  has presented today for surgery, with the diagnosis of prostate cancer  The various methods of treatment have been discussed with the patient and family. After consideration of risks, benefits and other options for treatment, the patient has consented to  Procedure(s): XI ROBOTIC Bruce (N/A) LYMPHADENECTOMY (Bilateral) as a surgical intervention .  The patient's history has been reviewed, patient examined, no change in status, stable for surgery.  I have reviewed the patient's chart and labs.  Questions were answered to the patient's satisfaction.     [name], III

## 2018-01-01 NOTE — Anesthesia Preprocedure Evaluation (Addendum)
Anesthesia Evaluation  Patient identified by MRN, date of birth, ID band Patient awake    Reviewed: Allergy & Precautions, NPO status , Patient's Chart, lab work & pertinent test results  Airway Mallampati: II  TM Distance: >3 FB Neck ROM: Full    Dental no notable dental hx. (+) Teeth Intact, Caps,    Pulmonary neg pulmonary ROS, Current Smoker,    Pulmonary exam normal breath sounds clear to auscultation       Cardiovascular hypertension, negative cardio ROS Normal cardiovascular exam Rhythm:Regular Rate:Normal     Neuro/Psych negative neurological ROS  negative psych ROS   GI/Hepatic negative GI ROS, Neg liver ROS,   Endo/Other  negative endocrine ROS  Renal/GU negative Renal ROS  negative genitourinary   Musculoskeletal negative musculoskeletal ROS (+)   Abdominal   Peds negative pediatric ROS (+)  Hematology negative hematology ROS (+)   Anesthesia Other Findings   Reproductive/Obstetrics negative OB ROS                            Anesthesia Physical Anesthesia Plan  ASA: II  Anesthesia Plan: General   Post-op Pain Management:    Induction: Intravenous  PONV Risk Score and Plan: 2 and Ondansetron and Treatment may vary due to age or medical condition  Airway Management Planned: Oral ETT  Additional Equipment:   Intra-op Plan:   Post-operative Plan: Extubation in OR  Informed Consent: I have reviewed the patients History and Physical, chart, labs and discussed the procedure including the risks, benefits and alternatives for the proposed anesthesia with the patient or authorized representative who has indicated his/her understanding and acceptance.     Plan Discussed with: CRNA, Anesthesiologist and Surgeon  Anesthesia Plan Comments: (  )        Anesthesia Quick Evaluation

## 2018-01-01 NOTE — Transfer of Care (Signed)
Immediate Anesthesia Transfer of Care Note  Patient: Jesse Lamb  Procedure(s) Performed: XI ROBOTIC ASSISTED LAPAROSCOPIC RADICAL PROSTATECTOMY (N/A ) LYMPHADENECTOMY (Bilateral )  Patient Location: PACU  Anesthesia Type:General  Level of Consciousness: awake, alert , oriented and patient cooperative  Airway & Oxygen Therapy: Patient Spontanous Breathing and Patient connected to face mask  Post-op Assessment: Report given to RN and Post -op Vital signs reviewed and stable  Post vital signs: Reviewed and stable  Last Vitals:  Vitals Value Taken Time  BP 144/89 01/01/2018  4:03 PM  Temp    Pulse 82 01/01/2018  4:05 PM  Resp 23 01/01/2018  4:05 PM  SpO2 93 % 01/01/2018  4:05 PM  Vitals shown include unvalidated device data.  Last Pain:  Vitals:   01/01/18 1129  TempSrc:   PainSc: 0-No pain         Complications: No apparent anesthesia complications
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
## 2018-01-01 NOTE — Anesthesia Procedure Notes (Signed)
Procedure Name: Intubation Date/Time: 01/01/2018 12:39 PM Performed by: Claudia Desanctis, CRNA Pre-anesthesia Checklist: Patient identified, Emergency Drugs available, Suction available and Patient being monitored Patient Re-evaluated:Patient Re-evaluated prior to induction Oxygen Delivery Method: Circle system utilized Preoxygenation: Pre-oxygenation with 100% oxygen Induction Type: IV induction Ventilation: Mask ventilation without difficulty Laryngoscope Size: 2 and Miller Grade View: Grade I Tube type: Oral Tube size: 7.5 mm Number of attempts: 1 Airway Equipment and Method: Stylet Placement Confirmation: ETT inserted through vocal cords under direct vision,  positive ETCO2 and breath sounds checked- equal and bilateral Secured at: 23 cm Tube secured with: Tape Dental Injury: Teeth and Oropharynx as per pre-operative assessment

## 2018-01-01 NOTE — Discharge Instructions (Signed)

## 2018-01-02 ENCOUNTER — Encounter (HOSPITAL_COMMUNITY): Payer: Self-pay | Admitting: Urology

## 2018-01-02 DIAGNOSIS — C61 Malignant neoplasm of prostate: Secondary | ICD-10-CM | POA: Diagnosis not present

## 2018-01-02 LAB — BASIC METABOLIC PANEL
Anion gap: 7 (ref 5–15)
BUN: 5 mg/dL — AB (ref 6–20)
CALCIUM: 8.5 mg/dL — AB (ref 8.9–10.3)
CHLORIDE: 106 mmol/L (ref 101–111)
CO2: 26 mmol/L (ref 22–32)
CREATININE: 0.58 mg/dL — AB (ref 0.61–1.24)
GFR calc Af Amer: >60 mL/min (ref >60)
GFR calc non Af Amer: >60 mL/min (ref >60)
Glucose, Bld: 133 mg/dL — ABNORMAL HIGH (ref 65–99)
Potassium: 3.9 mmol/L (ref 3.5–5.1)
SODIUM: 139 mmol/L (ref 135–145)

## 2018-01-02 LAB — HEMOGLOBIN AND HEMATOCRIT, BLOOD
HCT: 34.2 % — ABNORMAL LOW (ref 39.0–52.0)
Hemoglobin: 11.2 g/dL — ABNORMAL LOW (ref 13.0–17.0)

## 2018-01-02 MED ORDER — BISACODYL 10 MG RE SUPP
10.0000 mg | Freq: Once | RECTAL | Status: AC
  Administered 2018-01-02: 10 mg via RECTAL
  Filled 2018-01-02: qty 1

## 2018-01-02 NOTE — Discharge Summary (Signed)
Physician Discharge Summary  Patient ID: Jesse Lamb MRN: [id number] DOB/AGE: 05-20-1956 62 y.o.  Admit date: 01/01/2018 Discharge date: 01/02/2018  Admission Diagnoses:  Discharge Diagnoses:  Active Problems:   Prostate cancer [facility])   Discharged Condition: good  Hospital Course: Patient underwent a robotic assisted laparoscopic prostatectomy with bilateral lymph node dissection on 01/01/2018.  Patient tolerated the procedure well.  The following day, he initially had some issues with pain control that greatly improved especially after removal of his JP drain.  He was ambulating, tolerating a diet, pain was controlled.  Consults: None  Significant Diagnostic Studies: None Treatments: surgery: Robotic assisted laparoscopic prostatectomy with bilateral lymph node dissection  Discharge Exam: Blood pressure 109/71, pulse 72, temperature 98.3 F (36.8 C), temperature source Oral, resp. rate 18, height 5\' 11"  (1.803 m), weight 75.3 kg (166 lb), SpO2 95 %. General appearance: alert no acute distress Adequate peripheral perfusion of extremities Nonlabored respirations with symmetrical chest rise Abdomen is soft, appropriately tender to palpation, nondistended Foley catheter in place draining clear yellow urine  Disposition: Discharge disposition: 01-Home or Self Care        Allergies as of 01/02/2018      Reactions   Statins Shortness Of Breath   Severe weakness, Shortness of breath      Medication List    STOP taking these medications   ADULT GUMMY PO   naproxen sodium 220 MG tablet Commonly known as:  ALEVE     TAKE these medications   acetaminophen 500 MG tablet Commonly known as:  TYLENOL Take 500 mg by mouth every 6 (six) hours as needed (for pain.).   amLODipine 5 MG tablet Commonly known as:  NORVASC Take 5 mg by mouth daily.   esomeprazole 40 MG capsule Commonly known as:  NEXIUM Take 40 mg by mouth as needed.   HYDROcodone-acetaminophen 5-325 MG  tablet Commonly known as:  NORCO Take 1-2 tablets by mouth every 6 (six) hours as needed for moderate pain or severe pain.   lisinopril 10 MG tablet Commonly known as:  PRINIVIL,ZESTRIL Take 10 mg by mouth daily.      Follow-up Information    Lucas Mallow, MD Follow up on 01/09/2018.   Specialty:  Urology Why:  at 9:45 Contact information: [address] [place] [address] [phone]           Signed: [name], III 01/02/2018, 12:06 PM

## 2018-01-02 NOTE — Care Management Note (Signed)
Case Management Note  Patient Details  Name: Jesse Lamb MRN: [id number] Date of Birth: 08-18-1956  Subjective/Objective:  No CM needs.                  Action/Plan:d/c home.   Expected Discharge Date:  01/02/18               Expected Discharge Plan:  Home/Self Care  In-House Referral:     Discharge planning Services  CM Consult  Post Acute Care Choice:    Choice offered to:     DME Arranged:    DME Agency:     HH Arranged:    HH Agency:     Status of Service:  Completed, signed off  If discussed at [employer] of Stay Meetings, dates discussed:    Additional Comments:  Dessa Phi, RN 01/02/2018, 12:31 PM

## 2018-01-02 NOTE — Anesthesia Postprocedure Evaluation (Signed)
Anesthesia Post Note  Patient: Jesse Lamb  Procedure(s) Performed: XI ROBOTIC ASSISTED LAPAROSCOPIC RADICAL PROSTATECTOMY (N/A ) LYMPHADENECTOMY (Bilateral )     Patient location during evaluation: PACU Anesthesia Type: General Level of consciousness: awake and alert Pain management: pain level controlled Vital Signs Assessment: post-procedure vital signs reviewed and stable Respiratory status: spontaneous breathing, nonlabored ventilation and respiratory function stable Cardiovascular status: blood pressure returned to baseline and stable Postop Assessment: no apparent nausea or vomiting Anesthetic complications: no    Last Vitals:  Vitals:   01/02/18 0623 01/02/18 0644  BP: 95/61 109/71  Pulse: 72   Resp: 18   Temp: 36.8 C   SpO2: 95%     Last Pain:  Vitals:   01/02/18 0646  TempSrc:   PainSc: 10-Worst pain ever   Pain Goal: Patients Stated Pain Goal: 2 (01/02/18 0646)               Lynda Rainwater

## 2018-05-30 DIAGNOSIS — Z91018 Allergy to other foods: Secondary | ICD-10-CM | POA: Insufficient documentation

## 2018-06-01 DIAGNOSIS — E782 Mixed hyperlipidemia: Secondary | ICD-10-CM | POA: Insufficient documentation

## 2019-09-30 ENCOUNTER — Encounter: Payer: Self-pay | Admitting: Emergency Medicine

## 2019-09-30 ENCOUNTER — Other Ambulatory Visit: Payer: Self-pay

## 2019-09-30 ENCOUNTER — Emergency Department: Payer: BLUE CROSS/BLUE SHIELD

## 2019-09-30 ENCOUNTER — Observation Stay
Admission: EM | Admit: 2019-09-30 | Discharge: 2019-10-01 | Disposition: A | Discharge status: Home / Self Care | Payer: BLUE CROSS/BLUE SHIELD | Attending: Internal Medicine | Admitting: Internal Medicine

## 2019-09-30 DIAGNOSIS — W19XXXA Unspecified fall, initial encounter: Secondary | ICD-10-CM | POA: Diagnosis present

## 2019-09-30 DIAGNOSIS — Z8546 Personal history of malignant neoplasm of prostate: Secondary | ICD-10-CM | POA: Diagnosis not present

## 2019-09-30 DIAGNOSIS — I10 Essential (primary) hypertension: Secondary | ICD-10-CM | POA: Diagnosis not present

## 2019-09-30 DIAGNOSIS — K219 Gastro-esophageal reflux disease without esophagitis: Secondary | ICD-10-CM | POA: Diagnosis not present

## 2019-09-30 DIAGNOSIS — R4182 Altered mental status, unspecified: Secondary | ICD-10-CM | POA: Diagnosis present

## 2019-09-30 DIAGNOSIS — F1010 Alcohol abuse, uncomplicated: Secondary | ICD-10-CM | POA: Diagnosis not present

## 2019-09-30 DIAGNOSIS — I159 Secondary hypertension, unspecified: Secondary | ICD-10-CM

## 2019-09-30 DIAGNOSIS — Z23 Encounter for immunization: Secondary | ICD-10-CM | POA: Diagnosis not present

## 2019-09-30 DIAGNOSIS — G9341 Metabolic encephalopathy: Principal | ICD-10-CM | POA: Insufficient documentation

## 2019-09-30 DIAGNOSIS — E041 Nontoxic single thyroid nodule: Secondary | ICD-10-CM | POA: Diagnosis not present

## 2019-09-30 DIAGNOSIS — Z888 Allergy status to other drugs, medicaments and biological substances status: Secondary | ICD-10-CM | POA: Insufficient documentation

## 2019-09-30 DIAGNOSIS — Z79899 Other long term (current) drug therapy: Secondary | ICD-10-CM | POA: Diagnosis not present

## 2019-09-30 DIAGNOSIS — F17210 Nicotine dependence, cigarettes, uncomplicated: Secondary | ICD-10-CM | POA: Insufficient documentation

## 2019-09-30 DIAGNOSIS — W06XXXA Fall from bed, initial encounter: Secondary | ICD-10-CM | POA: Diagnosis not present

## 2019-09-30 DIAGNOSIS — Z720 Tobacco use: Secondary | ICD-10-CM

## 2019-09-30 DIAGNOSIS — C61 Malignant neoplasm of prostate: Secondary | ICD-10-CM | POA: Diagnosis present

## 2019-09-30 DIAGNOSIS — Z20828 Contact with and (suspected) exposure to other viral communicable diseases: Secondary | ICD-10-CM | POA: Diagnosis not present

## 2019-09-30 LAB — LIPASE, BLOOD: Lipase: 24 U/L (ref 11–51)

## 2019-09-30 LAB — URINALYSIS, COMPLETE (UACMP) WITH MICROSCOPIC
Bacteria, UA: NONE SEEN
Bilirubin Urine: NEGATIVE
Glucose, UA: NEGATIVE mg/dL
Hgb urine dipstick: NEGATIVE
Ketones, ur: NEGATIVE mg/dL
Leukocytes,Ua: NEGATIVE
Nitrite: NEGATIVE
Protein, ur: NEGATIVE mg/dL
Specific Gravity, Urine: 1.014 (ref 1.005–1.030)
pH: 7 (ref 5.0–8.0)

## 2019-09-30 LAB — TSH: TSH: 3.296 u[IU]/mL (ref 0.350–4.500)

## 2019-09-30 LAB — COMPREHENSIVE METABOLIC PANEL
ALT: 27 U/L (ref 0–44)
AST: 36 U/L (ref 15–41)
Albumin: 4 g/dL (ref 3.5–5.0)
Alkaline Phosphatase: 68 U/L (ref 38–126)
Anion gap: 13 (ref 5–15)
BUN: 10 mg/dL (ref 8–23)
CO2: 23 mmol/L (ref 22–32)
Calcium: 9.1 mg/dL (ref 8.9–10.3)
Chloride: 106 mmol/L (ref 98–111)
Creatinine, Ser: 0.46 mg/dL — ABNORMAL LOW (ref 0.61–1.24)
GFR calc Af Amer: >60 mL/min (ref >60)
GFR calc non Af Amer: >60 mL/min (ref >60)
Glucose, Bld: 138 mg/dL — ABNORMAL HIGH (ref 70–99)
Potassium: 3.5 mmol/L (ref 3.5–5.1)
Sodium: 142 mmol/L (ref 135–145)
Total Bilirubin: 0.8 mg/dL (ref 0.3–1.2)
Total Protein: 6.8 g/dL (ref 6.5–8.1)

## 2019-09-30 LAB — CREATININE, SERUM
Creatinine, Ser: 0.58 mg/dL — ABNORMAL LOW (ref 0.61–1.24)
GFR calc Af Amer: >60 mL/min (ref >60)
GFR calc non Af Amer: >60 mL/min (ref >60)

## 2019-09-30 LAB — CBC WITH DIFFERENTIAL/PLATELET
Abs Immature Granulocytes: 0.04 10*3/uL (ref 0.00–0.07)
Basophils Absolute: 0.1 10*3/uL (ref 0.0–0.1)
Basophils Relative: 1 %
Eosinophils Absolute: 0.1 10*3/uL (ref 0.0–0.5)
Eosinophils Relative: 1 %
HCT: 41 % (ref 39.0–52.0)
Hemoglobin: 14.8 g/dL (ref 13.0–17.0)
Immature Granulocytes: 0 %
Lymphocytes Relative: 26 %
Lymphs Abs: 2.9 10*3/uL (ref 0.7–4.0)
MCH: 35.8 pg — ABNORMAL HIGH (ref 26.0–34.0)
MCHC: 36.1 g/dL — ABNORMAL HIGH (ref 30.0–36.0)
MCV: 99.3 fL (ref 80.0–100.0)
Monocytes Absolute: 0.5 10*3/uL (ref 0.1–1.0)
Monocytes Relative: 5 %
Neutro Abs: 7.5 10*3/uL (ref 1.7–7.7)
Neutrophils Relative %: 67 %
Platelets: 313 10*3/uL (ref 150–400)
RBC: 4.13 MIL/uL — ABNORMAL LOW (ref 4.22–5.81)
RDW: 14 % (ref 11.5–15.5)
WBC: 11.1 10*3/uL — ABNORMAL HIGH (ref 4.0–10.5)
nRBC: 0 % (ref 0.0–0.2)

## 2019-09-30 LAB — URINE DRUG SCREEN, QUALITATIVE (ARMC ONLY)
Amphetamines, Ur Screen: NOT DETECTED
Barbiturates, Ur Screen: NOT DETECTED
Benzodiazepine, Ur Scrn: NOT DETECTED
Cannabinoid 50 Ng, Ur ~~LOC~~: NOT DETECTED
Cocaine Metabolite,Ur ~~LOC~~: NOT DETECTED
MDMA (Ecstasy)Ur Screen: NOT DETECTED
Methadone Scn, Ur: NOT DETECTED
Opiate, Ur Screen: NOT DETECTED
Phencyclidine (PCP) Ur S: NOT DETECTED
Tricyclic, Ur Screen: NOT DETECTED

## 2019-09-30 LAB — SARS CORONAVIRUS 2 (TAT 6-24 HRS): SARS Coronavirus 2: NEGATIVE

## 2019-09-30 LAB — MAGNESIUM: Magnesium: 1.8 mg/dL (ref 1.7–2.4)

## 2019-09-30 LAB — GLUCOSE, CAPILLARY: Glucose-Capillary: 120 mg/dL — ABNORMAL HIGH (ref 70–99)

## 2019-09-30 LAB — T4, FREE: Free T4: 0.69 ng/dL (ref 0.61–1.12)

## 2019-09-30 LAB — TROPONIN I (HIGH SENSITIVITY): Troponin I (High Sensitivity): 5 ng/L (ref <18)

## 2019-09-30 LAB — HIV ANTIBODY (ROUTINE TESTING W REFLEX): HIV Screen 4th Generation wRfx: REACTIVE — AB

## 2019-09-30 LAB — ETHANOL: Alcohol, Ethyl (B): 48 mg/dL — ABNORMAL HIGH (ref <10)

## 2019-09-30 MED ORDER — SODIUM CHLORIDE 0.9 % IV BOLUS
1000.0000 mL | Freq: Once | INTRAVENOUS | Status: AC
  Administered 2019-09-30: 1000 mL via INTRAVENOUS

## 2019-09-30 MED ORDER — SODIUM CHLORIDE 0.9 % IV SOLN: INTRAVENOUS | Status: DC

## 2019-09-30 MED ORDER — NICOTINE 21 MG/24HR TD PT24
21.0000 mg | MEDICATED_PATCH | Freq: Every day | TRANSDERMAL | Status: DC
  Administered 2019-09-30 – 2019-10-01 (×2): 21 mg via TRANSDERMAL
  Filled 2019-09-30 (×2): qty 1

## 2019-09-30 MED ORDER — AMLODIPINE BESYLATE 5 MG PO TABS
5.0000 mg | ORAL_TABLET | Freq: Every day | ORAL | Status: DC
  Administered 2019-09-30 – 2019-10-01 (×2): 5 mg via ORAL
  Filled 2019-09-30 (×2): qty 1

## 2019-09-30 MED ORDER — PANTOPRAZOLE SODIUM 20 MG PO TBEC
20.0000 mg | DELAYED_RELEASE_TABLET | Freq: Every day | ORAL | Status: DC | PRN
  Filled 2019-09-30: qty 1

## 2019-09-30 MED ORDER — ONDANSETRON HCL 4 MG/2ML IJ SOLN: 4.0000 mg | Freq: Three times a day (TID) | INTRAMUSCULAR | Status: DC | PRN

## 2019-09-30 MED ORDER — ADULT MULTIVITAMIN W/MINERALS CH
1.0000 | ORAL_TABLET | Freq: Every day | ORAL | Status: DC
  Administered 2019-09-30 – 2019-10-01 (×2): 1 via ORAL
  Filled 2019-09-30 (×2): qty 1

## 2019-09-30 MED ORDER — LORAZEPAM 2 MG/ML IJ SOLN: 0.0000 mg | Freq: Two times a day (BID) | INTRAMUSCULAR | Status: DC

## 2019-09-30 MED ORDER — LORAZEPAM 2 MG/ML IJ SOLN: 1.0000 mg | INTRAMUSCULAR | Status: DC | PRN

## 2019-09-30 MED ORDER — FOLIC ACID 1 MG PO TABS
1.0000 mg | ORAL_TABLET | Freq: Every day | ORAL | Status: DC
  Administered 2019-09-30 – 2019-10-01 (×2): 1 mg via ORAL
  Filled 2019-09-30 (×2): qty 1

## 2019-09-30 MED ORDER — PNEUMOCOCCAL VAC POLYVALENT 25 MCG/0.5ML IJ INJ
0.5000 mL | INJECTION | INTRAMUSCULAR | Status: AC
  Administered 2019-10-01: 16:00:00 0.5 mL via INTRAMUSCULAR
  Filled 2019-09-30: qty 0.5

## 2019-09-30 MED ORDER — PANCRELIPASE (LIP-PROT-AMYL) 12000-38000 UNITS PO CPEP
24000.0000 [IU] | ORAL_CAPSULE | Freq: Three times a day (TID) | ORAL | Status: DC
  Administered 2019-10-01: 24000 [IU] via ORAL
  Filled 2019-09-30 (×6): qty 2

## 2019-09-30 MED ORDER — LORAZEPAM 1 MG PO TABS
1.0000 mg | ORAL_TABLET | ORAL | Status: DC | PRN
  Administered 2019-09-30: 2 mg via ORAL
  Filled 2019-09-30: qty 2

## 2019-09-30 MED ORDER — ONDANSETRON HCL 4 MG/2ML IJ SOLN: 4.0000 mg | Freq: Once | INTRAMUSCULAR | Status: AC

## 2019-09-30 MED ORDER — MIRTAZAPINE 15 MG PO TABS
15.0000 mg | ORAL_TABLET | Freq: Every day | ORAL | Status: DC
  Administered 2019-09-30: 15 mg via ORAL
  Filled 2019-09-30 (×3): qty 1

## 2019-09-30 MED ORDER — LISINOPRIL 10 MG PO TABS
10.0000 mg | ORAL_TABLET | Freq: Every day | ORAL | Status: DC
  Administered 2019-09-30 – 2019-10-01 (×2): 10 mg via ORAL
  Filled 2019-09-30 (×2): qty 1

## 2019-09-30 MED ORDER — LORAZEPAM 2 MG/ML IJ SOLN: 0.0000 mg | Freq: Four times a day (QID) | INTRAMUSCULAR | Status: DC

## 2019-09-30 MED ORDER — ACETAMINOPHEN 650 MG RE SUPP: 650.0000 mg | Freq: Four times a day (QID) | RECTAL | Status: DC | PRN

## 2019-09-30 MED ORDER — ENOXAPARIN SODIUM 40 MG/0.4ML ~~LOC~~ SOLN
40.0000 mg | SUBCUTANEOUS | Status: DC
  Administered 2019-09-30 – 2019-10-01 (×2): 40 mg via SUBCUTANEOUS
  Filled 2019-09-30 (×2): qty 0.4

## 2019-09-30 MED ORDER — ONDANSETRON HCL 4 MG/2ML IJ SOLN
INTRAMUSCULAR | Status: AC
  Administered 2019-09-30: 05:00:00 4 mg via INTRAVENOUS
  Filled 2019-09-30: qty 2

## 2019-09-30 MED ORDER — THIAMINE HCL 100 MG PO TABS
100.0000 mg | ORAL_TABLET | Freq: Every day | ORAL | Status: DC
  Administered 2019-09-30 – 2019-10-01 (×2): 100 mg via ORAL
  Filled 2019-09-30 (×2): qty 1

## 2019-09-30 MED ORDER — THIAMINE HCL 100 MG/ML IJ SOLN: 100.0000 mg | Freq: Every day | INTRAMUSCULAR | Status: DC

## 2019-09-30 MED ORDER — ACETAMINOPHEN 325 MG PO TABS: 650.0000 mg | ORAL_TABLET | Freq: Four times a day (QID) | ORAL | Status: DC | PRN

## 2019-09-30 MED ORDER — HYDRALAZINE HCL 25 MG PO TABS
25.0000 mg | ORAL_TABLET | Freq: Three times a day (TID) | ORAL | Status: DC | PRN
  Filled 2019-09-30: qty 1

## 2019-09-30 MED ORDER — THIAMINE HCL 100 MG/ML IJ SOLN: 100.0000 mg | Freq: Once | INTRAMUSCULAR | Status: DC

## 2019-09-30 NOTE — Progress Notes (Signed)
    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 HIV viral load order placed to determine confirmation of results or potential for false positive.    Terri Piedra, NP [facility] for [place] [phone] Pager  09/30/2019  5:43 PM

## 2019-09-30 NOTE — ED Notes (Signed)
Patient transported to CT 

## 2019-09-30 NOTE — ED Provider Notes (Signed)
[facility] Emergency Department Provider Note   ____________________________________________   First MD Initiated Contact with Patient 09/30/19 [phone]     (approximate)  I have reviewed the triage vital signs and the nursing notes.   HISTORY  Chief Complaint Altered Mental Status    HPI Jesse Lamb is a 63 y.o. male with possible history of hypertension and GERD who presents to the ED for altered mental status.  History is limited as patient is not able to provide any coherent history.  Per EMS, wife had gone to bed with the patient around 730 last night.  She then woke up to the sound of him falling against the wall in the hallway.  She found him confused with difficulty speaking.  EMS stated that he was somnolent upon their arrival, subsequently woke up and seemed to have deviation of gaze to the left.  Wife had stated to EMS that he has a history of alcohol abuse, but she was unsure whether he had been drinking last night.  He is not anticoagulated.        Past Medical History:  Diagnosis Date  . Aortic atherosclerosis ([place])   . Diverticulosis   . Epistaxis   . GERD (gastroesophageal reflux disease)   . Hypertension   . IBS (irritable bowel syndrome)   . Inguinal hernia    small, left  . Prostate CA [facility])     Patient Active Problem List   Diagnosis Date Noted  . Hypertension   . GERD (gastroesophageal reflux disease)   . Fall   . Acute metabolic encephalopathy   . Tobacco abuse   . Alcohol abuse   . Thyroid nodule   . Prostate cancer ([place]) 01/01/2018    Past Surgical History:  Procedure Laterality Date  . COLONOSCOPY    . LYMPHADENECTOMY Bilateral 01/01/2018   Procedure: LYMPHADENECTOMY;  Surgeon: Lucas Mallow, MD;  Location: [facility] ORS;  Service: Urology;  Laterality: Bilateral;  . ROBOT ASSISTED LAPAROSCOPIC RADICAL PROSTATECTOMY N/A 01/01/2018   Procedure: XI ROBOTIC ASSISTED LAPAROSCOPIC RADICAL PROSTATECTOMY;  Surgeon: Lucas Mallow, MD;  Location: [facility] ORS;  Service: Urology;  Laterality: N/A;  . UPPER GI ENDOSCOPY      Prior to Admission medications   Medication Sig Start Date End Date Taking? Authorizing Provider  acetaminophen (TYLENOL) 500 MG tablet Take 500 mg by mouth every 6 (six) hours as needed (for pain.).    [provider]  amLODipine (NORVASC) 5 MG tablet Take 5 mg by mouth daily. 10/29/17   [provider]  esomeprazole (NEXIUM) 40 MG capsule Take 40 mg by mouth as needed.    [provider]  HYDROcodone-acetaminophen (NORCO) 5-325 MG tablet Take 1-2 tablets by mouth every 6 (six) hours as needed for moderate pain or severe pain. 01/01/18   Debbrah Alar, PA-C  lisinopril (PRINIVIL,ZESTRIL) 10 MG tablet Take 10 mg by mouth daily. 10/29/17   [provider]    Allergies Statins  History reviewed. No pertinent family history.  Social History Social History   Tobacco Use  . Smoking status: Current Every Day Smoker    Packs/day: 0.50    Years: 30.00    Pack years: 15.00    Types: Cigarettes  . Smokeless tobacco: Never Used  Substance Use Topics  . Alcohol use: Yes  . Drug use: No    Review of Systems Unable to obtain secondary to altered mental status  ____________________________________________   PHYSICAL EXAM:  VITAL SIGNS:  ED Triage Vitals  Enc Vitals Group     BP      Pulse      Resp      Temp      Temp src      SpO2      Weight      Height      Head Circumference      Peak Flow      Pain Score      Pain Loc      Pain Edu?      Excl. in [place]?     Constitutional: Alert but not communicative Eyes: Conjunctivae are normal.  Gaze deviation to the left, pupils equal round and reactive to light bilaterally. Head: Atraumatic. Nose: No congestion/rhinnorhea. Mouth/Throat: Mucous membranes are moist. Neck: Normal ROM Cardiovascular: Normal rate, regular rhythm. Grossly normal heart sounds. Respiratory: Normal respiratory effort.   No retractions. Lungs CTAB. Gastrointestinal: Soft and nontender. No distention. Genitourinary: deferred Musculoskeletal: No lower extremity tenderness nor edema. Neurologic: Moving all extremities equally, withdraws from pain in all extremities. Skin:  Skin is warm, dry and intact. No rash noted. Psychiatric: Incoherent speech.  ____________________________________________   LABS (all labs ordered are listed, but only abnormal results are displayed)  Labs Reviewed  COMPREHENSIVE METABOLIC PANEL - Abnormal; Notable for the following components:      Result Value   Glucose, Bld 138 (*)    Creatinine, Ser 0.46 (*)    All other components within normal limits  CBC WITH DIFFERENTIAL/PLATELET - Abnormal; Notable for the following components:   WBC 11.1 (*)    RBC 4.13 (*)    MCH 35.8 (*)    MCHC 36.1 (*)    All other components within normal limits  ETHANOL - Abnormal; Notable for the following components:   Alcohol, Ethyl (B) 48 (*)    All other components within normal limits  SARS CORONAVIRUS 2 (TAT 6-24 HRS)  LIPASE, BLOOD  URINALYSIS, COMPLETE (UACMP) WITH MICROSCOPIC  URINE DRUG SCREEN, QUALITATIVE ([facility] ONLY)  TSH  T4, FREE  TROPONIN I (HIGH SENSITIVITY)  TROPONIN I (HIGH SENSITIVITY)   ____________________________________________  EKG  ED ECG REPORT I, Blake Divine, the attending physician, personally viewed and interpreted this ECG.   Date: 09/30/2019  EKG Time: 5:15  Rate: 102  Rhythm: sinus tachycardia  Axis: Normal  Intervals:Prolonged QT  ST&T Change: None   PROCEDURES  Procedure(s) performed (including Critical Care):  Procedures   ____________________________________________   INITIAL IMPRESSION / ASSESSMENT AND PLAN / ED COURSE       64 year old male presents to the ED for altered mental status after his wife awoke to find him having fallen against the wall.  He has no obvious signs of trauma to his head.  He does seem confused and is  minimally verbal on arrival, attempting to get up out of bed and only saying "stop" when attempted to place IV and monitor.  He is moving all of his extremities equally and does not appear to have any focal deficits.  CT head and C-spine were obtained and negative for acute process.  There is not appear to be any evidence of infection, chest x-ray negative for acute process.  Blood work shows mildly elevated blood alcohol, but this does not seem to fully explain his change in mental status.  He has had gradual improvement, now seems more alert but continues to be confused, thinking he is still at home.  Given his fall and confusion, along with his  history of alcohol abuse, differential includes Warnicke's encephalopathy and we will give an IV dose of thiamine.  Would also consider seizure and subsequent postictal state for his altered mental status.  He does not appear to have any medications on his list that could explain his current presentation. Given the acute change, I would be less suspicious for hepatic encephalopathy.  Case was discussed with hospitalist, who accepts patient for admission.      ____________________________________________   FINAL CLINICAL IMPRESSION(S) / ED DIAGNOSES  Final diagnoses:  Altered mental status, unspecified altered mental status type  Alcohol abuse     ED Discharge Orders    None       Note:  This document was prepared using Dragon voice recognition software and may include unintentional dictation errors.   Blake Divine, MD 09/30/19 [phone]

## 2019-09-30 NOTE — ED Triage Notes (Signed)
Patient presents to Emergency Department via [facility] from home with complaints of falling out of bed and AMS>    Pt with ETOH abuse hx. EMS reports ETOH smell and new left eye deviation. LKW 1930

## 2019-09-30 NOTE — ED Notes (Signed)
Bed alarm placed on patient. Patient oriented to self only. States "I'm at home" when asked where he is. Patient calm and cooperative with this RN with reoriented to place and situation.

## 2019-09-30 NOTE — ED Notes (Addendum)
Patient assisted to stand at bedside and use urinal. Patient shaky but able to stand without assistance. Specimen collected and sent to lab. Patient repositioned in bed and covered with warm blanket. No further needs expressed. Wife at bedside with patient. Patient oriented to self, time and place at this time.

## 2019-09-30 NOTE — H&P (Addendum)
History and Physical    Jesse Lamb [id number] DOB: 21-Sep-1956 DOA: 09/30/2019  Referring MD/NP/PA:   PCP: Raelene Bott, MD   Patient coming from:  The patient is coming from home.  At baseline, pt is independent for most of ADL.        Chief Complaint: fall and AMS  HPI: Jesse Lamb is a 63 y.o. male with medical history significant of hypertension, GERD, prostate cancer (s/p of prostatectomy), IBS, diverticulosis, alcohol abuse, tobacco abuse, who presents with fall and altered mental status.  Patient has AMS and is unable to provide accurate medical history, therefore, most of the history is obtained from his wife who is at bedside.   Per his wife, she had gone to bed with the patient around 730 last night.  She then woke up to the sound of him falling against the wall in the hallway at about 4:30 AM. She found him confused and combative. Wife reported that pt has been having anxiety d/t prostrate CA and more than usual alcohol use recently. EMS stated that he was somnolent upon their arrival, subsequently woke up and seemed to have deviation of gaze to the left.  When I saw pt in ED. He is confused, knows his own name, not oriented to time and place. No active seizure activity. He moves all extremities.  No active respiratory distress, cough, nausea, vomiting, diarrhea noted.  He denies chest pain or abdominal pain. No facial droop or slurred speech.   ED Course: pt was found to have lipase 24, pending COVID-19 test, alcohol level 48, electrolytes renal function okay, temperature normal, blood pressure 157/99, heart rate 87, oxygen saturation 95 to 97% on room air, chest x-ray negative.  CT head is negative for acute intracranial abnormalities, CT of C-spine is negative for bony fracture, but it showed thyroid nodule.  Patient is placed on MedSurg bed for observation.  Review of Systems: Could not be reviewed accurately due to altered mental status.  Allergy:  Allergies   Allergen Reactions  . Statins Shortness Of Breath    Severe weakness, Shortness of breath    Past Medical History:  Diagnosis Date  . Aortic atherosclerosis ([place])   . Diverticulosis   . Epistaxis   . GERD (gastroesophageal reflux disease)   . Hypertension   . IBS (irritable bowel syndrome)   . Inguinal hernia    small, left  . Prostate CA [facility])     Past Surgical History:  Procedure Laterality Date  . COLONOSCOPY    . LYMPHADENECTOMY Bilateral 01/01/2018   Procedure: LYMPHADENECTOMY;  Surgeon: Lucas Mallow, MD;  Location: [facility];  Service: Urology;  Laterality: Bilateral;  . ROBOT ASSISTED LAPAROSCOPIC RADICAL PROSTATECTOMY N/A 01/01/2018   Procedure: XI ROBOTIC ASSISTED LAPAROSCOPIC RADICAL PROSTATECTOMY;  Surgeon: Lucas Mallow, MD;  Location: [facility];  Service: Urology;  Laterality: N/A;  . UPPER GI ENDOSCOPY      Social History:  reports that he has been smoking cigarettes. He has a 15.00 pack-year smoking history. He has never used smokeless tobacco. He reports current alcohol use. He reports that he does not use drugs.  Family History: History reviewed. No pertinent family history.  Could not be reviewed due to altered mental status  Prior to Admission medications   Medication Sig Start Date End Date Taking? Authorizing Provider  acetaminophen (TYLENOL) 500 MG tablet Take 500 mg by mouth every 6 (six) hours as needed (for pain.).    [provider]  amLODipine (  NORVASC) 5 MG tablet Take 5 mg by mouth daily. 10/29/17   [provider]  esomeprazole (NEXIUM) 40 MG capsule Take 40 mg by mouth as needed.    [provider]  HYDROcodone-acetaminophen (NORCO) 5-325 MG tablet Take 1-2 tablets by mouth every 6 (six) hours as needed for moderate pain or severe pain. 01/01/18   Debbrah Alar, PA-C  lisinopril (PRINIVIL,ZESTRIL) 10 MG tablet Take 10 mg by mouth daily. 10/29/17   [provider]    Physical Exam: Vitals:   09/30/19 0522  09/30/19 0543 09/30/19 0600 09/30/19 0700  BP:  (!) 157/99 (!) 148/93 (!) 157/142  Pulse:  87 83 95  Resp:  20 15   Temp:      TempSrc:      SpO2:  97% 93% 97%  Weight: 90.7 kg     Height: 5\' 11"  (1.803 m)      General: Not in acute distress.  Dry mucosal membrane HEENT:       Eyes: PERRL, EOMI, no scleral icterus.       ENT: No discharge from the ears and nose, no pharynx injection, no tonsillar enlargement.        Neck: No JVD, no bruit, no mass felt. Heme: No neck lymph node enlargement. Cardiac: S1/S2, RRR, No murmurs, No gallops or rubs. Respiratory: No rales, wheezing, rhonchi or rubs. GI: Soft, nondistended, nontender, no organomegaly, BS present. GU: No hematuria Ext: No pitting leg edema bilaterally. 2+DP/PT pulse bilaterally. Musculoskeletal: No joint deformities, No joint redness or warmth, no limitation of ROM in spin. Skin: No rashes.  Neuro: confused, knows his own name, not oriented to place and time. Cranial nerves II-XII grossly intact, moves all extremities normally. Muscle strength 5/5 in all extremities. Brachial reflex 2+ bilaterally. Negative Babinski's sign. Psych: Patient is not psychotic, no suicidal or hemocidal ideation.  Labs on Admission: I have personally reviewed following labs and imaging studies  CBC: Recent Labs  Lab 09/30/19 0519  WBC 11.1*  NEUTROABS 7.5  HGB 14.8  HCT 41.0  MCV 99.3  PLT [id number]   Basic Metabolic Panel: Recent Labs  Lab 09/30/19 0519  NA 142  K 3.5  CL 106  CO2 23  GLUCOSE 138*  BUN 10  CREATININE 0.46*  CALCIUM 9.1   GFR: Estimated Creatinine Clearance: 111.8 mL/min (A) (by C-G formula based on SCr of 0.46 mg/dL (L)). Liver Function Tests: Recent Labs  Lab 09/30/19 0519  AST 36  ALT 27  ALKPHOS 68  BILITOT 0.8  PROT 6.8  ALBUMIN 4.0   Recent Labs  Lab 09/30/19 0519  LIPASE 24   No results for input(s): AMMONIA in the last 168 hours. Coagulation Profile: No results for input(s): INR, PROTIME in  the last 168 hours. Cardiac Enzymes: No results for input(s): CKTOTAL, CKMB, CKMBINDEX, TROPONINI in the last 168 hours. BNP (last 3 results) No results for input(s): PROBNP in the last 8760 hours. HbA1C: No results for input(s): HGBA1C in the last 72 hours. CBG: No results for input(s): GLUCAP in the last 168 hours. Lipid Profile: No results for input(s): CHOL, HDL, LDLCALC, TRIG, CHOLHDL, LDLDIRECT in the last 72 hours. Thyroid Function Tests: No results for input(s): TSH, T4TOTAL, FREET4, T3FREE, THYROIDAB in the last 72 hours. Anemia Panel: No results for input(s): VITAMINB12, FOLATE, FERRITIN, TIBC, IRON, RETICCTPCT in the last 72 hours. Urine analysis: No results found for: COLORURINE, APPEARANCEUR, LABSPEC, PHURINE, GLUCOSEU, HGBUR, BILIRUBINUR, KETONESUR, PROTEINUR, UROBILINOGEN, NITRITE, LEUKOCYTESUR Sepsis Labs: @LABRCNTIP (procalcitonin:4,lacticidven:4) )No results  found for this or any previous visit (from the past 240 hour(s)).   Radiological Exams on Admission: CT Head Wo Contrast  Result Date: 09/30/2019 CLINICAL DATA:  Fall from bed with altered mental status EXAM: CT HEAD WITHOUT CONTRAST CT CERVICAL SPINE WITHOUT CONTRAST TECHNIQUE: Multidetector CT imaging of the head and cervical spine was performed following the standard protocol without intravenous contrast. Multiplanar CT image reconstructions of the cervical spine were also generated. COMPARISON:  None. FINDINGS: CT HEAD FINDINGS Brain: No evidence of acute infarction, hemorrhage, hydrocephalus, extra-axial collection or mass lesion/mass effect. Mild presumed chronic small vessel ischemic change in the cerebral white matter. Vascular: No hyperdense vessel or unexpected calcification. Skull: Normal. Negative for fracture or focal lesion. Sinuses/Orbits: No acute finding. CT CERVICAL SPINE FINDINGS Alignment: Normal Skull base and vertebrae: Negative for fracture Soft tissues and spinal canal: No prevertebral fluid or  swelling. No visible canal hematoma. 3.7 cm nodule in the right lobe thyroid without evident invasive feature or adenopathy. Disc levels:  No significant degenerative changes Upper chest: Negative IMPRESSION: 1. No evidence of acute intracranial or cervical spine injury. 2. 3.7 cm right thyroid nodule. Recommend thyroid US.(ref: J Am Coll Radiol. 2015 Feb;12(2): 143-50). Electronically Signed   By: Monte Fantasia M.D.   On: 09/30/2019 05:51   CT Cervical Spine Wo Contrast  Result Date: 09/30/2019 CLINICAL DATA:  Fall from bed with altered mental status EXAM: CT HEAD WITHOUT CONTRAST CT CERVICAL SPINE WITHOUT CONTRAST TECHNIQUE: Multidetector CT imaging of the head and cervical spine was performed following the standard protocol without intravenous contrast. Multiplanar CT image reconstructions of the cervical spine were also generated. COMPARISON:  None. FINDINGS: CT HEAD FINDINGS Brain: No evidence of acute infarction, hemorrhage, hydrocephalus, extra-axial collection or mass lesion/mass effect. Mild presumed chronic small vessel ischemic change in the cerebral white matter. Vascular: No hyperdense vessel or unexpected calcification. Skull: Normal. Negative for fracture or focal lesion. Sinuses/Orbits: No acute finding. CT CERVICAL SPINE FINDINGS Alignment: Normal Skull base and vertebrae: Negative for fracture Soft tissues and spinal canal: No prevertebral fluid or swelling. No visible canal hematoma. 3.7 cm nodule in the right lobe thyroid without evident invasive feature or adenopathy. Disc levels:  No significant degenerative changes Upper chest: Negative IMPRESSION: 1. No evidence of acute intracranial or cervical spine injury. 2. 3.7 cm right thyroid nodule. Recommend thyroid US.(ref: J Am Coll Radiol. 2015 Feb;12(2): 143-50). Electronically Signed   By: Monte Fantasia M.D.   On: 09/30/2019 05:51   DG Chest Portable 1 View  Result Date: 09/30/2019 CLINICAL DATA:  Altered mental status. [place] out of  bed. EXAM: PORTABLE CHEST 1 VIEW COMPARISON:  11/23/2017 FINDINGS: Lower lung volumes from prior exam.The cardiomediastinal contours are normal. Atherosclerosis of the aortic arch. The lungs are clear. Pulmonary vasculature is normal. No consolidation, pleural effusion, or pneumothorax. No acute osseous abnormalities are seen. IMPRESSION: No acute abnormality. Aortic Atherosclerosis (ICD10-I70.0). Electronically Signed   By: Keith Rake M.D.   On: 09/30/2019 06:24     EKG:  Not done in ED, will get one.   Assessment/Plan Principal Problem:   Acute metabolic encephalopathy Active Problems:   Prostate cancer ([place])   Hypertension   GERD (gastroesophageal reflux disease)   Fall   Tobacco abuse   Alcohol abuse   Thyroid nodule   Acute metabolic encephalopathy: Etiology is not clear.  CT head is negative for acute intracranial abnormalities.  CT of C-spine is negative for bony fracture.  Potential differential diagnosis include alcohol  intoxication, seizure, Wernicke's encephalopathy and drug abuse  -Please MedSurg bed for observation -Frequent neuro check -Give vitamin B1 -EEG - check UDS -Seizure precaution -IV fluids -if no improvement -->will need to consult neurology  Fall: Secondary to altered mental status as above -PT/OT  HTN:  -Continue home medications: Amlodipine, lisinopril -hydralazine prn  GERD (gastroesophageal reflux disease): -PPI  Tobacco abuse and Alcohol abuse: -Nicotine patch -CIWA protocol  Thyroid nodule: CT of C spin showed a 3.7 cm right thyroid nodule. -will check TSH and Free T4 -->normal for both -f/u with PCP  Hx of prostate cancer: s/p of prostatectomy by Dr. Link Snuffer. -Follow-up with the urologist     DVT ppx: SQ Lovenox Code Status: Full code Family Communication: wife is at bed side.   Disposition Plan:  Anticipate discharge back to previous home environment Consults called:  none Admission status: Med-surg bed for  obs   Date of Service 09/30/2019    [facility]   If 7PM-7AM, please contact night-coverage [email or web address] Password [facility] 09/30/2019, 7:46 AM

## 2019-09-30 NOTE — ED Notes (Signed)
Pt Babinski reflex intact; responds to most stimuli with "stop it - leave me alone!", open eyes to name but doesn't answer questions
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
## 2019-09-30 NOTE — ED Notes (Signed)
Pt not following commands, unable to access swallow screen or NIH, pt dry heaving during triage, pt has reduced pupillary reflex

## 2019-09-30 NOTE — ED Notes (Signed)
Cary Gracy called for update in another pt's room

## 2019-09-30 NOTE — ED Notes (Signed)
Necklace with cross removed during CT and in specimen cup in pt's belonging bag

## 2019-10-01 DIAGNOSIS — I10 Essential (primary) hypertension: Secondary | ICD-10-CM

## 2019-10-01 DIAGNOSIS — K589 Irritable bowel syndrome without diarrhea: Secondary | ICD-10-CM

## 2019-10-01 DIAGNOSIS — R75 Inconclusive laboratory evidence of human immunodeficiency virus [HIV]: Secondary | ICD-10-CM

## 2019-10-01 DIAGNOSIS — C61 Malignant neoplasm of prostate: Secondary | ICD-10-CM

## 2019-10-01 DIAGNOSIS — G9341 Metabolic encephalopathy: Secondary | ICD-10-CM | POA: Diagnosis not present

## 2019-10-01 DIAGNOSIS — K219 Gastro-esophageal reflux disease without esophagitis: Secondary | ICD-10-CM

## 2019-10-01 DIAGNOSIS — I159 Secondary hypertension, unspecified: Secondary | ICD-10-CM | POA: Diagnosis not present

## 2019-10-01 DIAGNOSIS — F10980 Alcohol use, unspecified with alcohol-induced anxiety disorder: Secondary | ICD-10-CM

## 2019-10-01 DIAGNOSIS — F17210 Nicotine dependence, cigarettes, uncomplicated: Secondary | ICD-10-CM

## 2019-10-01 DIAGNOSIS — Z888 Allergy status to other drugs, medicaments and biological substances status: Secondary | ICD-10-CM

## 2019-10-01 DIAGNOSIS — F1010 Alcohol abuse, uncomplicated: Secondary | ICD-10-CM | POA: Diagnosis not present

## 2019-10-01 LAB — CBC
HCT: 35.8 % — ABNORMAL LOW (ref 39.0–52.0)
Hemoglobin: 13.4 g/dL (ref 13.0–17.0)
MCH: 36.2 pg — ABNORMAL HIGH (ref 26.0–34.0)
MCHC: 37.4 g/dL — ABNORMAL HIGH (ref 30.0–36.0)
MCV: 96.8 fL (ref 80.0–100.0)
Platelets: 267 10*3/uL (ref 150–400)
RBC: 3.7 MIL/uL — ABNORMAL LOW (ref 4.22–5.81)
RDW: 14.4 % (ref 11.5–15.5)
WBC: 11.1 10*3/uL — ABNORMAL HIGH (ref 4.0–10.5)
nRBC: 0 % (ref 0.0–0.2)

## 2019-10-01 LAB — BASIC METABOLIC PANEL
Anion gap: 9 (ref 5–15)
BUN: 5 mg/dL — ABNORMAL LOW (ref 8–23)
CO2: 23 mmol/L (ref 22–32)
Calcium: 8.4 mg/dL — ABNORMAL LOW (ref 8.9–10.3)
Chloride: 111 mmol/L (ref 98–111)
Creatinine, Ser: 0.51 mg/dL — ABNORMAL LOW (ref 0.61–1.24)
GFR calc Af Amer: >60 mL/min (ref >60)
GFR calc non Af Amer: >60 mL/min (ref >60)
Glucose, Bld: 119 mg/dL — ABNORMAL HIGH (ref 70–99)
Potassium: 3.1 mmol/L — ABNORMAL LOW (ref 3.5–5.1)
Sodium: 143 mmol/L (ref 135–145)

## 2019-10-01 LAB — GLUCOSE, CAPILLARY: Glucose-Capillary: 116 mg/dL — ABNORMAL HIGH (ref 70–99)

## 2019-10-01 LAB — MAGNESIUM: Magnesium: 1.7 mg/dL (ref 1.7–2.4)

## 2019-10-01 MED ORDER — ADULT MULTIVITAMIN W/MINERALS CH: 1.0000 | ORAL_TABLET | Freq: Every day | ORAL | 0 refills | Status: DC

## 2019-10-01 MED ORDER — THIAMINE HCL 100 MG PO TABS: 100.0000 mg | ORAL_TABLET | Freq: Every day | ORAL | 0 refills | Status: DC

## 2019-10-01 MED ORDER — POTASSIUM CHLORIDE CRYS ER 20 MEQ PO TBCR
40.0000 meq | EXTENDED_RELEASE_TABLET | Freq: Once | ORAL | Status: AC
  Administered 2019-10-01: 40 meq via ORAL
  Filled 2019-10-01: qty 2

## 2019-10-01 MED ORDER — FOLIC ACID 1 MG PO TABS: 1.0000 mg | ORAL_TABLET | Freq: Every day | ORAL | 0 refills | Status: DC

## 2019-10-01 NOTE — Consult Note (Signed)
PHARMACY CONSULT NOTE  Pharmacy Consult for Electrolyte Monitoring and Replacement   Recent Labs: Potassium (mmol/L)  Date Value  10/01/2019 3.1 (L)   Magnesium (mg/dL)  Date Value  10/01/2019 1.7   Calcium (mg/dL)  Date Value  10/01/2019 8.4 (L)   Albumin (g/dL)  Date Value  09/30/2019 4.0   Sodium (mmol/L)  Date Value  10/01/2019 143    Assessment: 63 y.o. male with medical history significant of hypertension, GERD, prostate cancer (s/p of prostatectomy), IBS, diverticulosis, alcohol abuse, tobacco abuse, who presents with fall and altered mental status. He is at high risk for re-feeding syndrome  Goal of Therapy:  Electrolytes WNL  Plan:   Potassium replaced with 40 mEq oral KCl by by Dr Posey Pronto  Renal function panel, magnesium level in am  Dallie Piles ,PharmD Clinical Pharmacist 10/01/2019 11:10 AM

## 2019-10-01 NOTE — Progress Notes (Signed)
eeg completed ° °

## 2019-10-01 NOTE — Progress Notes (Signed)
Spoke to [employer] regarding patient's potassium level- 3.1.  Orders were placed for 40 mEq po potassium and mag level draw.  Will continue to monitor.  Christene Slates
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
## 2019-10-01 NOTE — Discharge Instructions (Signed)
Infectious disease Dr Delaine Lame will follow up on  Your test results

## 2019-10-01 NOTE — Plan of Care (Signed)
Patient discharge education packet given and reviewed.  Patient and wife verbalized understanding, no question or concerns noted.  PIV removed with tip intact. Pcv23 given and education with immunization record given to patient.  Patient transported to front entrance to go home with wife.

## 2019-10-01 NOTE — Discharge Summary (Signed)
[facility] [place] NAME: Jesse Lamb    MR#:  [id number]  DATE OF BIRTH:  [birth year]  DATE OF ADMISSION:  09/30/2019 ADMITTING PHYSICIAN: Ivor Costa, MD  DATE OF DISCHARGE: 10/01/2019  PRIMARY CARE PHYSICIAN: Raelene Bott, MD    ADMISSION DIAGNOSIS:  Alcohol abuse [F10.10] Altered mental status, unspecified altered mental status type [id number] Acute metabolic encephalopathy [id number]  DISCHARGE DIAGNOSIS:  Acute metabolic encephalopathy suspected due to alcohol intoxication  SECONDARY DIAGNOSIS:   Past Medical History:  Diagnosis Date  . Aortic atherosclerosis ([place])   . Diverticulosis   . Epistaxis   . GERD (gastroesophageal reflux disease)   . Hypertension   . IBS (irritable bowel syndrome)   . Inguinal hernia    small, left  . Prostate CA [facility])     HOSPITAL COURSE:  Jesse Lamb is a 63 y.o. male with medical history significant of hypertension, GERD, prostate cancer (s/p of prostatectomy), IBS, diverticulosis, alcohol abuse, tobacco abuse, who presents with fall and altered mental status.Wife reported that pt has been having anxiety d/t prostrate ca and more than usual alcohol use recently.   Acute metabolic encephalopathy:  suspected due to alcohol intoxication -per wife patient lately has been stressed out and has been drinking more than usual. No injury. -Serum EtOH level was 48 - CT head is negative for acute intracranial abnormalities. -  CT of C-spine is negative for bony fracture.   -patient scoring zero on CIWA- -multivitamin, folate, thiamine -EEG--- no epileptiform activity noted -  UDS-- negative -received IV fluids -patient feels back to baseline. Hemodynamically stable  Fall: Secondary to altered mental status as above -related to the bathroom without any help according to RN  HTN:  -Continue home medications: Amlodipine, lisinopril -hydralazine prn  GERD (gastroesophageal reflux  disease): -PPI  Tobacco abuse and Alcohol abuse: -Nicotine patch  Thyroid nodule: CT of C spin showed a 3.7 cm right thyroid nodule. -will check TSH and Free T4 -->normal for both -f/u with PCP  Hx of prostate cancer: s/p of prostatectomy by Dr. Link Snuffer. -Follow-up with the urologist  Reactive HIV test -patient was seen by ID Dr. Tama High. Should follow-up the HIV viral load test results with patient.  Discussed with patient's wife at bedside. Will discharge to home. NO Home needs identified  DVT ppx: SQ Lovenox Code Status: Full code Family Communication: wife is at bed side.   Disposition Plan:   discharge back to previous home environment Consults called: ID CONSULTS OBTAINED:  Treatment Team:  Tsosie Billing, MD  DRUG ALLERGIES:   Allergies  Allergen Reactions  . Statins Shortness Of Breath    Severe weakness, Shortness of breath    DISCHARGE MEDICATIONS:   Allergies as of 10/01/2019      Reactions   Statins Shortness Of Breath   Severe weakness, Shortness of breath      Medication List    TAKE these medications   acetaminophen 500 MG tablet Commonly known as: TYLENOL Take 500-1,000 mg by mouth every 6 (six) hours as needed for mild pain or fever.   amLODipine 5 MG tablet Commonly known as: NORVASC Take 5 mg by mouth daily.   folic acid 1 MG tablet Commonly known as: FOLVITE Take 1 tablet (1 mg total) by mouth daily. Start taking on: October 02, 2019   lisinopril 10 MG tablet Commonly known as: ZESTRIL Take 10 mg by mouth daily.   mirtazapine 15 MG tablet Commonly known as:  REMERON Take 15 mg by mouth at bedtime.   multivitamin with minerals Tabs tablet Take 1 tablet by mouth daily. Start taking on: October 02, 2019   omeprazole 20 MG tablet Commonly known as: PRILOSEC OTC Take 20 mg by mouth daily as needed (acid reflus symptoms).   thiamine 100 MG tablet Take 1 tablet (100 mg total) by mouth daily. Start taking  on: October 02, 2019   Zenpep 20000-63000 units Cpep Generic drug: Pancrelipase (Lip-Prot-Amyl) Take 1 capsule by mouth 3 (three) times daily before meals.       If you experience worsening of your admission symptoms, develop shortness of breath, life threatening emergency, suicidal or homicidal thoughts you must seek medical attention immediately by calling 911 or calling your MD immediately  if symptoms less severe.  You Must read complete instructions/literature along with all the possible adverse reactions/side effects for all the Medicines you take and that have been prescribed to you. Take any new Medicines after you have completely understood and accept all the possible adverse reactions/side effects.   Please note  You were cared for by a hospitalist during your hospital stay. If you have any questions about your discharge medications or the care you received while you were in the hospital after you are discharged, you can call the unit and asked to speak with the hospitalist on call if the hospitalist that took care of you is not available. Once you are discharged, your primary care physician will handle any further medical issues. Please note that NO REFILLS for any discharge medications will be authorized once you are discharged, as it is imperative that you return to your primary care physician (or establish a relationship with a primary care physician if you do not have one) for your aftercare needs so that they can reassess your need for medications and monitor your lab values. Today   SUBJECTIVE   Denies any complaints. Wife in the room. Patient was a bit stressed out about HIV test VITAL SIGNS:  Blood pressure (!) 146/94, pulse 77, temperature 97.9 F (36.6 C), resp. rate 18, height 5\' 11"  (1.803 m), weight 90.7 kg, SpO2 97 %.  I/O:    Intake/Output Summary (Last 24 hours) at 10/01/2019 1354 Last data filed at 10/01/2019 0400 Gross per 24 hour  Intake 2246.61 ml   Output -  Net 2246.61 ml    PHYSICAL EXAMINATION:  GENERAL:  63 y.o.-year-old patient lying in the bed with no acute distress.  EYES: Pupils equal, round, reactive to light and accommodation. No scleral icterus. Extraocular muscles intact.  HEENT: Head atraumatic, normocephalic. Oropharynx and nasopharynx clear.  NECK:  Supple, no jugular venous distention. No thyroid enlargement, no tenderness.  LUNGS: Normal breath sounds bilaterally, no wheezing, rales,rhonchi or crepitation. No use of accessory muscles of respiration.  CARDIOVASCULAR: S1, S2 normal. No murmurs, rubs, or gallops.  ABDOMEN: Soft, non-tender, non-distended. Bowel sounds present. No organomegaly or mass.  EXTREMITIES: No pedal edema, cyanosis, or clubbing.  NEUROLOGIC: Cranial nerves II through XII are intact. Muscle strength 5/5 in all extremities. Sensation intact. Gait not checked.  PSYCHIATRIC: The patient is alert and oriented x 3.  SKIN: No obvious rash, lesion, or ulcer.   DATA REVIEW:   CBC  Recent Labs  Lab 10/01/19 0527  WBC 11.1*  HGB 13.4  HCT 35.8*  PLT 267    Chemistries  Recent Labs  Lab 09/30/19 0519 09/30/19 0739 10/01/19 0527  NA 142  --  143  K 3.5  --  3.1*  CL 106  --  111  CO2 23  --  23  GLUCOSE 138*  --  119*  BUN 10  --  5*  CREATININE 0.46*  --  0.51*  CALCIUM 9.1  --  8.4*  MG  --    < > 1.7  AST 36  --   --   ALT 27  --   --   ALKPHOS 68  --   --   BILITOT 0.8  --   --    < > = values in this interval not displayed.    Microbiology Results   Recent Results (from the past 240 hour(s))  SARS CORONAVIRUS 2 (TAT 6-24 HRS) Nasopharyngeal Nasopharyngeal Swab     Status: None   Collection Time: 09/30/19  7:39 AM   Specimen: Nasopharyngeal Swab  Result Value Ref Range Status   SARS Coronavirus 2 NEGATIVE NEGATIVE Final    Comment: (NOTE) SARS-CoV-2 target nucleic acids are NOT DETECTED. The SARS-CoV-2 RNA is generally detectable in upper and lower respiratory  specimens during the acute phase of infection. Negative results do not preclude SARS-CoV-2 infection, do not rule out co-infections with other pathogens, and should not be used as the sole basis for treatment or other patient management decisions. Negative results must be combined with clinical observations, patient history, and epidemiological information. The expected result is Negative. Fact Sheet for Patients: SugarRoll.be Fact Sheet for Healthcare Providers: https://www.woods-mathews.com/ This test is not yet approved or cleared by the [place] FDA and  has been authorized for detection and/or diagnosis of SARS-CoV-2 by FDA under an Emergency Use Authorization (EUA). This EUA will remain  in effect (meaning this test can be used) for the duration of the COVID-19 declaration under Section 56 4(b)(1) of the Act, 21 U.S.C. section 360bbb-3(b)(1), unless the authorization is terminated or revoked sooner. Performed at [facility], [place] [address], [place], [place] [address]     RADIOLOGY:  CT Head Wo Contrast  Result Date: 09/30/2019 CLINICAL DATA:  Fall from bed with altered mental status EXAM: CT HEAD WITHOUT CONTRAST CT CERVICAL SPINE WITHOUT CONTRAST TECHNIQUE: Multidetector CT imaging of the head and cervical spine was performed following the standard protocol without intravenous contrast. Multiplanar CT image reconstructions of the cervical spine were also generated. COMPARISON:  None. FINDINGS: CT HEAD FINDINGS Brain: No evidence of acute infarction, hemorrhage, hydrocephalus, extra-axial collection or mass lesion/mass effect. Mild presumed chronic small vessel ischemic change in the cerebral white matter. Vascular: No hyperdense vessel or unexpected calcification. Skull: Normal. Negative for fracture or focal lesion. Sinuses/Orbits: No acute finding. CT CERVICAL SPINE FINDINGS Alignment: Normal Skull base and vertebrae: Negative  for fracture Soft tissues and spinal canal: No prevertebral fluid or swelling. No visible canal hematoma. 3.7 cm nodule in the right lobe thyroid without evident invasive feature or adenopathy. Disc levels:  No significant degenerative changes Upper chest: Negative IMPRESSION: 1. No evidence of acute intracranial or cervical spine injury. 2. 3.7 cm right thyroid nodule. Recommend thyroid US.(ref: J Am Coll Radiol. 2015 Feb;12(2): 143-50). Electronically Signed   By: Monte Fantasia M.D.   On: 09/30/2019 05:51   CT Cervical Spine Wo Contrast  Result Date: 09/30/2019 CLINICAL DATA:  Fall from bed with altered mental status EXAM: CT HEAD WITHOUT CONTRAST CT CERVICAL SPINE WITHOUT CONTRAST TECHNIQUE: Multidetector CT imaging of the head and cervical spine was performed following the standard protocol without intravenous contrast. Multiplanar CT image reconstructions of the cervical spine were  also generated. COMPARISON:  None. FINDINGS: CT HEAD FINDINGS Brain: No evidence of acute infarction, hemorrhage, hydrocephalus, extra-axial collection or mass lesion/mass effect. Mild presumed chronic small vessel ischemic change in the cerebral white matter. Vascular: No hyperdense vessel or unexpected calcification. Skull: Normal. Negative for fracture or focal lesion. Sinuses/Orbits: No acute finding. CT CERVICAL SPINE FINDINGS Alignment: Normal Skull base and vertebrae: Negative for fracture Soft tissues and spinal canal: No prevertebral fluid or swelling. No visible canal hematoma. 3.7 cm nodule in the right lobe thyroid without evident invasive feature or adenopathy. Disc levels:  No significant degenerative changes Upper chest: Negative IMPRESSION: 1. No evidence of acute intracranial or cervical spine injury. 2. 3.7 cm right thyroid nodule. Recommend thyroid US.(ref: J Am Coll Radiol. 2015 Feb;12(2): 143-50). Electronically Signed   By: Monte Fantasia M.D.   On: 09/30/2019 05:51   DG Chest Portable 1 View  Result  Date: 09/30/2019 CLINICAL DATA:  Altered mental status. [place] out of bed. EXAM: PORTABLE CHEST 1 VIEW COMPARISON:  11/23/2017 FINDINGS: Lower lung volumes from prior exam.The cardiomediastinal contours are normal. Atherosclerosis of the aortic arch. The lungs are clear. Pulmonary vasculature is normal. No consolidation, pleural effusion, or pneumothorax. No acute osseous abnormalities are seen. IMPRESSION: No acute abnormality. Aortic Atherosclerosis (ICD10-I70.0). Electronically Signed   By: Keith Rake M.D.   On: 09/30/2019 06:24   EEG adult  Result Date: 10/01/2019 Lora Havens, MD     10/01/2019 11:20 AM Patient Name: Jesse Lamb MRN: [id number] Epilepsy Attending: Lora Havens Referring Physician/Provider: Dr Ivor Costa Date: 10/01/2019 Duration: 30.20 minutes Patient history: 63 year old male presented with altered mental status.  EEG to evaluate for seizures. Level of alertness: Awake AEDs during EEG study: Lorazepam Technical aspects: This EEG study was done with scalp electrodes positioned according to the 10-20 International system of electrode placement. Electrical activity was acquired at a sampling rate of 500Hz  and reviewed with a high frequency filter of 70Hz  and a low frequency filter of 1Hz . EEG data were recorded continuously and digitally stored. Description: The posterior dominant rhythm consists of 9 Hz activity of moderate voltage (25-35 uV) seen predominantly in posterior head regions, symmetric and reactive to eye opening and eye closing.  Physiologic photic driving was seen during photic stimulation.  Hyperventilation was not performed.      IMPRESSION: This study is within normal limits. No seizures or epileptiform discharges were seen throughout the recording. Priyanka Barbra Sarks     CODE STATUS:     Code Status Orders  (From admission, onward)         Start     Ordered   09/30/19 0713  Full code  Continuous     09/30/19 0713        Code Status History     Date Active Date Inactive Code Status Order ID Comments User Context   01/01/2018 1751 01/02/2018 1737 Full Code [id number]  Debbrah Alar, PA-C Inpatient   Advance Care Planning Activity    Advance Directive Documentation     Most Recent Value  Type of Advance Directive  Living will  Pre-existing out of facility DNR order (yellow form or pink MOST form)  -  "MOST" Form in Place?  -       TOTAL TIME TAKING CARE OF THIS PATIENT: *40* minutes.    Fritzi Mandes M.D on 10/01/2019 at 1:54 PM  Between 7am to 6pm - Pager - [phone] After 6pm go to [email or web address] - password [email or web address]  [facility]    CC: Primary care physician; Raelene Bott, MD

## 2019-10-01 NOTE — Procedures (Signed)
Patient Name: Jesse Lamb  MRN: [id number]  Epilepsy Attending: Lora Havens  Referring Physician/Provider: Dr Ivor Costa Date: 10/01/2019 Duration: 30.20 minutes  Patient history: 63 year old male presented with altered mental status.  EEG to evaluate for seizures.  Level of alertness: Awake  AEDs during EEG study: Lorazepam  Technical aspects: This EEG study was done with scalp electrodes positioned according to the 10-20 International system of electrode placement. Electrical activity was acquired at a sampling rate of 500Hz  and reviewed with a high frequency filter of 70Hz  and a low frequency filter of 1Hz . EEG data were recorded continuously and digitally stored.   Description: The posterior dominant rhythm consists of 9 Hz activity of moderate voltage (25-35 uV) seen predominantly in posterior head regions, symmetric and reactive to eye opening and eye closing.  Physiologic photic driving was seen during photic stimulation.  Hyperventilation was not performed.        IMPRESSION: This study is within normal limits. No seizures or epileptiform discharges were seen throughout the recording.  Jamesa Tedrick Barbra Sarks

## 2019-10-02 LAB — PANEL 083904
HIV 1 AB: NEGATIVE
HIV 2 AB: NEGATIVE
Note: NEGATIVE

## 2019-10-02 LAB — HIV-1 RNA QUANT-NO REFLEX-BLD
HIV 1 RNA Quant: <20 copies/mL
LOG10 HIV-1 RNA: UNDETERMINED log10copy/mL

## 2019-10-03 ENCOUNTER — Telehealth: Payer: Self-pay | Admitting: Infectious Diseases

## 2019-10-03 NOTE — Telephone Encounter (Signed)
Informed patient that the confirmatory HIV test and VL were both negative- So his initial antibody test was false positive.

## 2019-10-19 IMAGING — DX DG RIBS 2V*L*
3 series · 3 of 3 positions shown · non-contrast
Comparison: Nuclear bone scan of November 17, 2017.

CLINICAL DATA: Follow-up bone scan abnormality over the anterior
aspect of the left sixth rib. History of prostate malignancy.

EXAM:
LEFT RIBS - 2 VIEW

[rib ap (1 of 2)]
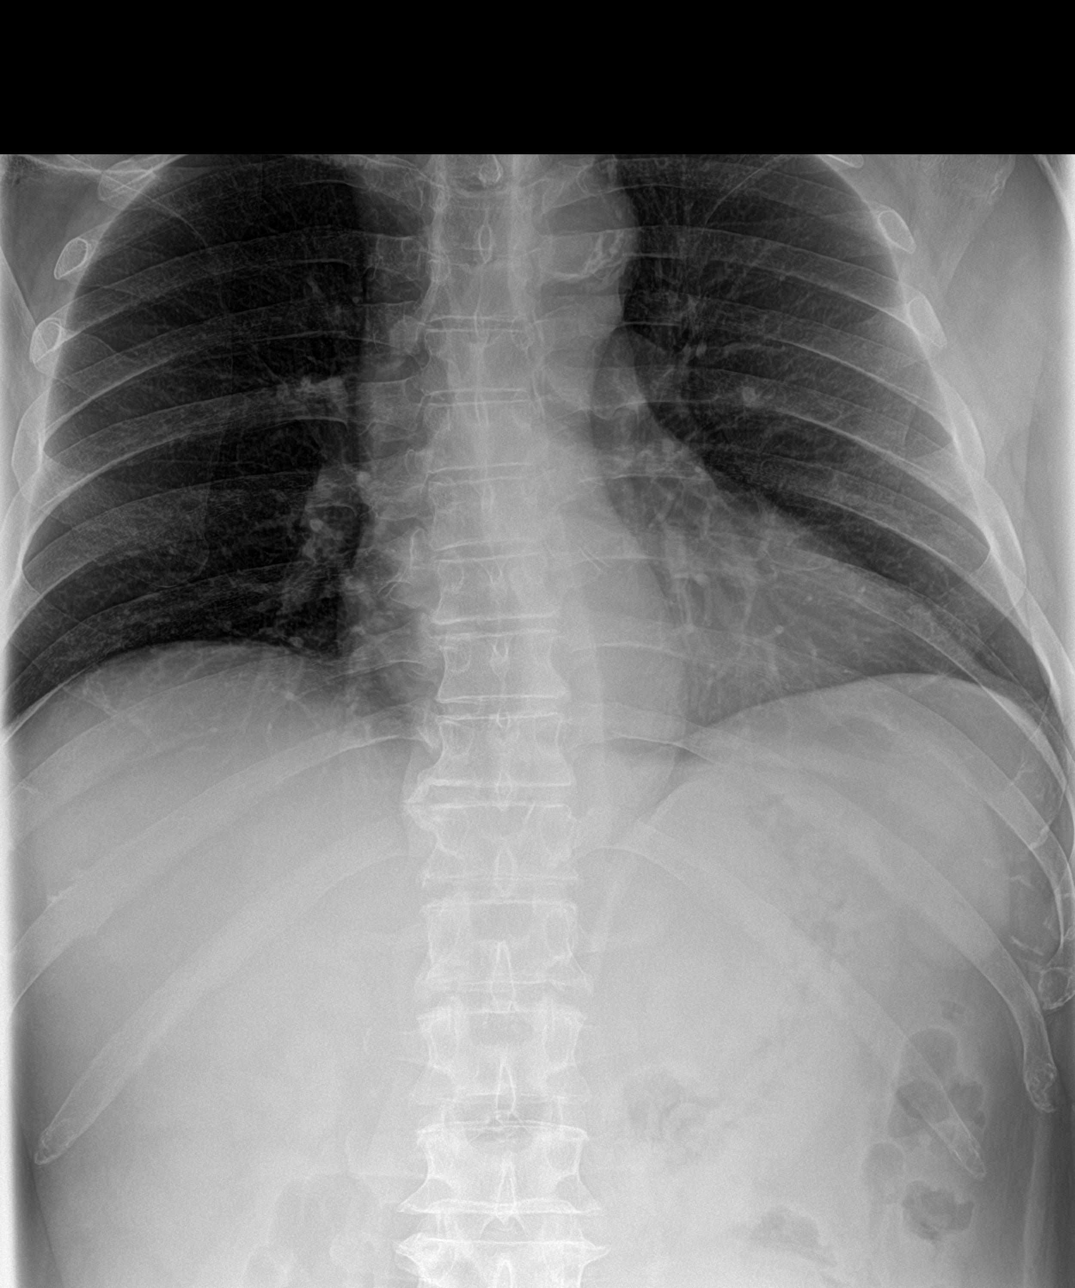

[rib ap obl]
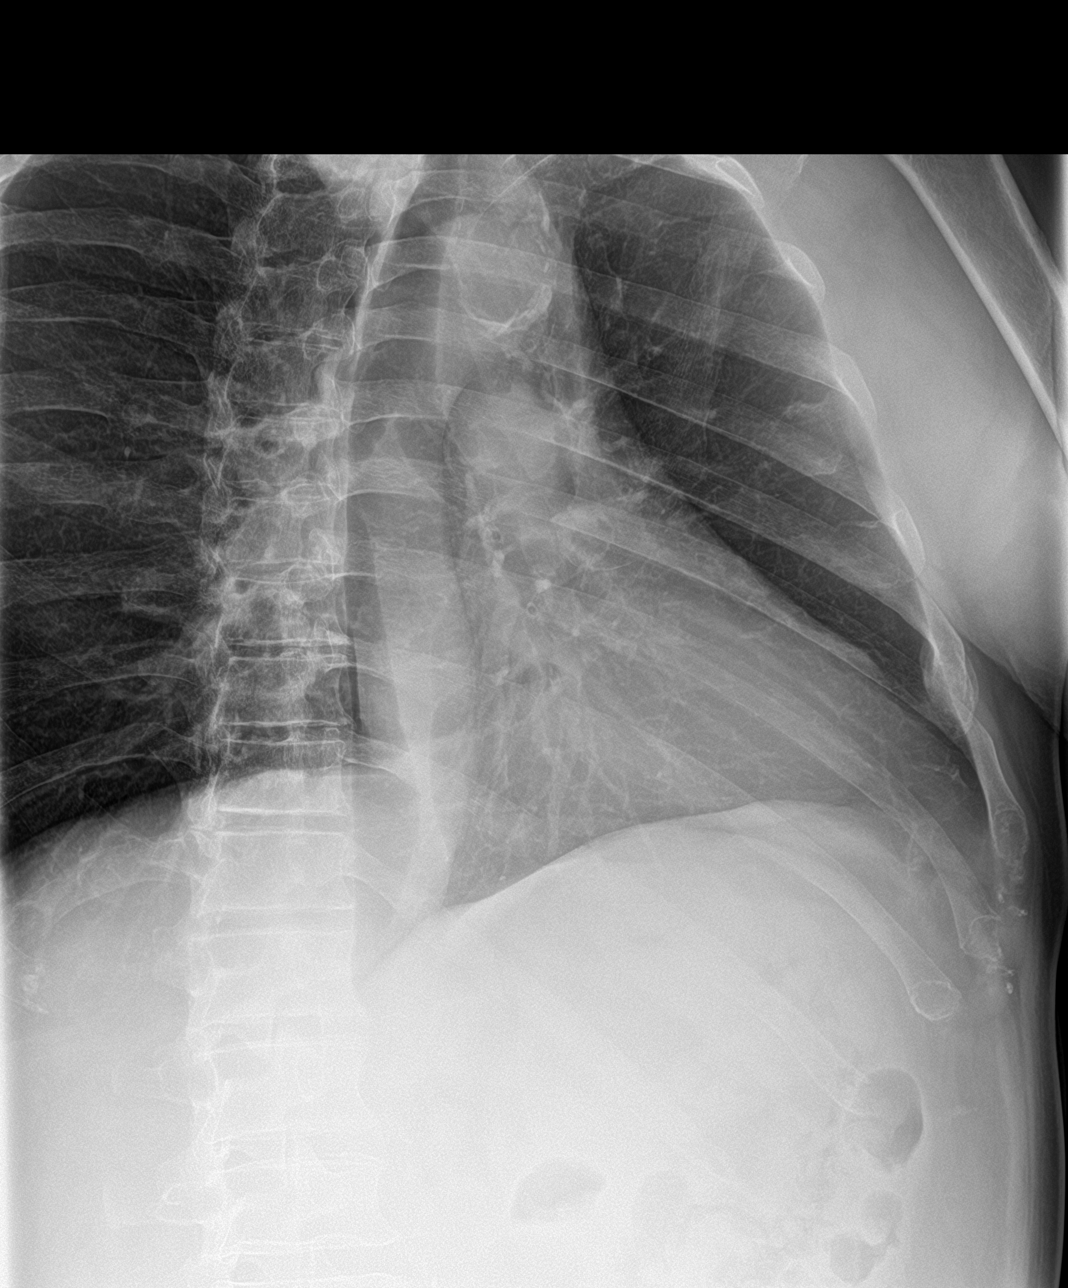

[rib ap (2 of 2)]
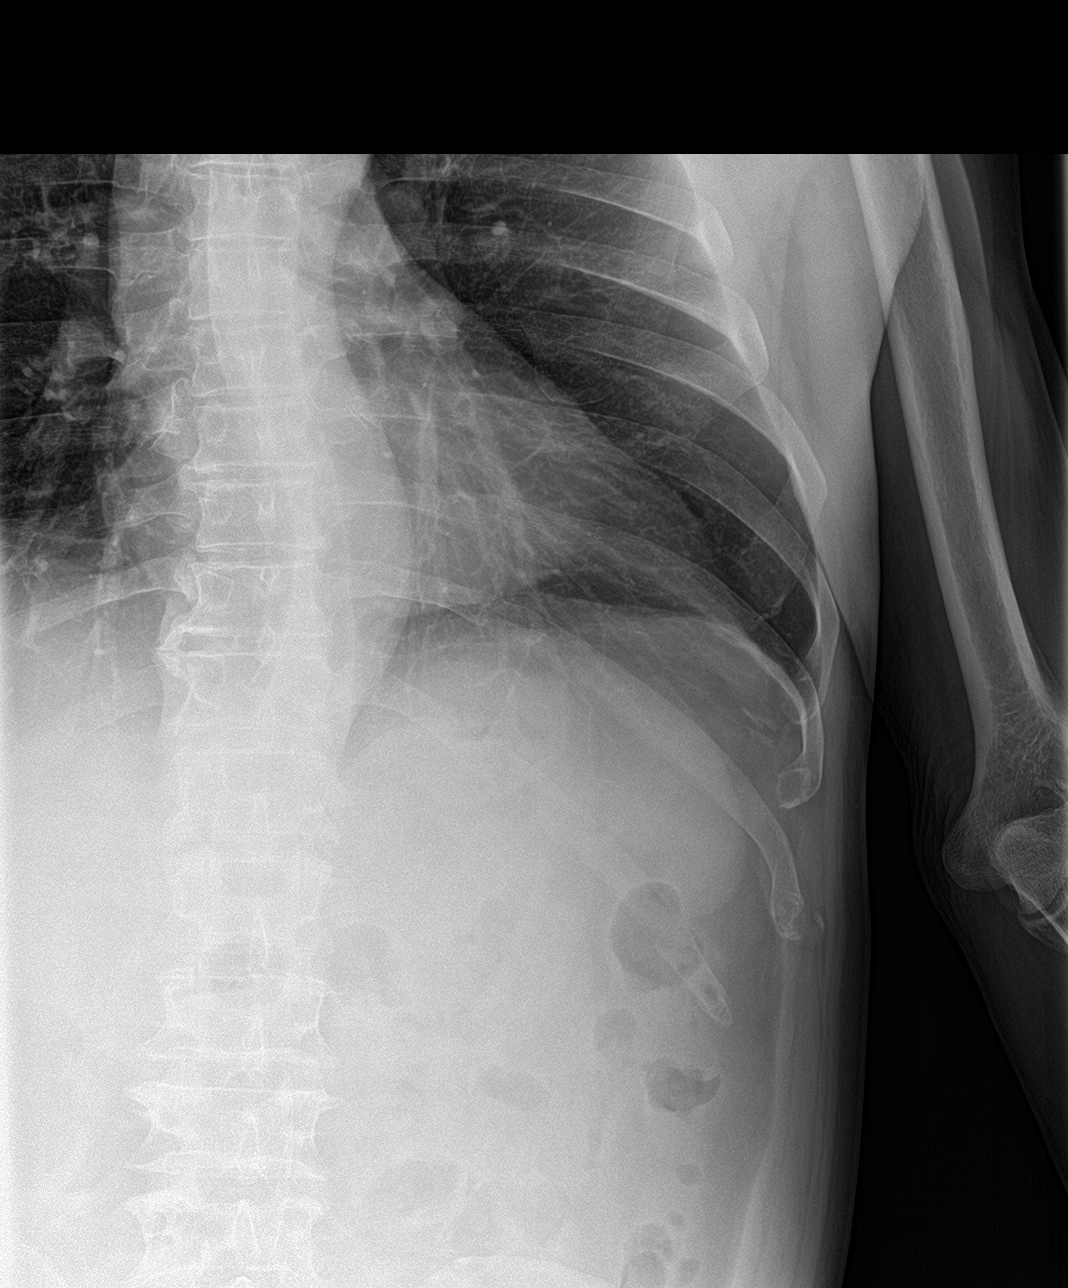

[3 of 3 positions shown; findings below may reference images not displayed]

FINDINGS: The visualized left ribs are subjectively adequately mineralized.
There is cortical irregularity of the anterior aspect of the left 6
rib which corresponds to the bone scan abnormality. This is likely
post traumatic. No other left rib lesion is observed.

There is dense calcification in the wall of the aortic arch.
IMPRESSION: Probable post traumatic deformity of the anterior aspect of the left
6 rib which corresponds to the bone scan finding. Correlation with
the patient's clinical examination in this region would be useful.

Thoracic aortic atherosclerosis.

## 2019-10-25 ENCOUNTER — Encounter: Payer: Self-pay | Admitting: *Deleted

## 2019-11-07 NOTE — Progress Notes (Signed)
GU Location of Tumor / Histology: prostatic adenocarcinoma  If Prostate Cancer, Gleason Score is (4 + 4) and PSA is (5.8). Prostate volume:   Jesse Lamb had a prostate biopsy in January 2019 revealing gleason 4+4 prostate cancer after right prostate induration was noted on exam. Then, patient had a prostatectomy in March of 2019. The patient's PSA remained undetectable until May 2020.      Past/Anticipated interventions by urology, if any: prostate biopsy, prostatectomy, surveillance, referral for consideration of salvage radiotherapy to manage slowly rising PSA  Past/Anticipated interventions by medical oncology, if any: no  Weight changes, if any: no  Bowel/Bladder complaints, if any: IPSS 5. SHIM 1. Denies dysuria or hematuria. Nocturia x 1-2. Reports ED. Incontinence has resolved.   Nausea/Vomiting, if any: no  Pain issues, if any:  denies  SAFETY ISSUES:  Prior radiation? denies  Pacemaker/ICD? denies  Possible current pregnancy? no, male patient  Is the patient on methotrexate? 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 Married with two daughters and two sons.

## 2019-11-08 ENCOUNTER — Encounter: Payer: Self-pay | Admitting: Radiation Oncology

## 2019-11-08 ENCOUNTER — Encounter: Payer: Self-pay | Admitting: Urology

## 2019-11-08 ENCOUNTER — Ambulatory Visit
Admission: RE | Admit: 2019-11-08 | Discharge: 2019-11-08 | Disposition: A | Discharge status: Home / Self Care | Payer: BLUE CROSS/BLUE SHIELD | Source: Ambulatory Visit | Attending: Radiation Oncology | Admitting: Radiation Oncology

## 2019-11-08 ENCOUNTER — Other Ambulatory Visit: Payer: Self-pay

## 2019-11-08 VITALS — Ht 72.0 in

## 2019-11-08 DIAGNOSIS — K432 Incisional hernia without obstruction or gangrene: Secondary | ICD-10-CM | POA: Insufficient documentation

## 2019-11-08 DIAGNOSIS — C61 Malignant neoplasm of prostate: Secondary | ICD-10-CM

## 2019-11-08 NOTE — Progress Notes (Signed)
Radiation Oncology         [phone] ________________________________  Initial outpatient Consultation - Conducted via Telephone due to current COVID-19 concerns for limiting patient exposure  Name: Jesse Lamb MRN: [id number]  Date: 11/08/2019  DOB: 1955-11-14  AC:[id number], [place], MD  Lucas Mallow, MD   REFERRING PHYSICIAN: Lucas Mallow, MD  DIAGNOSIS: 64 y.o. gentleman with rising, detectable PSA, at 0.039 s/p RALP in 12/2016 for Stage pT3a, pN0 Gleason 4+4 prostate cancer    ICD-10-CM   1. Malignant neoplasm of prostate ([place])  C61     HISTORY OF PRESENT ILLNESS: Jesse Lamb is a 64 y.o. male with a diagnosis of prostate cancer. He was initially seen by Dr. Gloriann Loan in 09/2017, for an elevated PSA of 5.8, with digital rectal exam at that time showing induration on the right. He underwent prostate biopsy on 117//2019 and was found to have Gleason 4+4 prostate cancer. Disease staging with CT A/P and bone scan performed in 11/2017 were negative for visceral or osseous metastases.  He elected to proceed with RALP with BPLND on 01/01/18 under the care and direction of Dr. Gloriann Loan for treatment of his prostate cancer. Final surgical pathology revealed: pT3aN0, prostatic acinar adenocarcinoma, Gleason 4+4, with tertiary Gleason pattern 5 and focal extraprostatic extension.  Surgical resection margins were negative but noted to come very close on the right with perineural invasion present but no seminal vesicle involvement and negative lymph nodes (0/4).  His initial postoperative PSA in 05/2018 was undetectable, and it remained undetectable until 02/2019 when it was noted at 0.020.  This has continued on a very slow, gradual rise with his most recent PSA at 0.039 on 09/26/2019.  The patient reviewed the surgical pathology and recent PSA results with his urologist and he has kindly been referred today for discussion of potential adjuvant/salvage radiation treatment options.    PREVIOUS  RADIATION THERAPY: No  PAST MEDICAL HISTORY:  Past Medical History:  Diagnosis Date   Aortic atherosclerosis (HCC)    Diverticulosis    Epistaxis    GERD (gastroesophageal reflux disease)    Hypertension    IBS (irritable bowel syndrome)    Inguinal hernia    small, left   Prostate CA ([place])       PAST SURGICAL HISTORY: Past Surgical History:  Procedure Laterality Date   COLONOSCOPY     LYMPHADENECTOMY Bilateral 01/01/2018   Procedure: LYMPHADENECTOMY;  Surgeon: Lucas Mallow, MD;  Location: [facility];  Service: Urology;  Laterality: Bilateral;   ROBOT ASSISTED LAPAROSCOPIC RADICAL PROSTATECTOMY N/A 01/01/2018   Procedure: XI ROBOTIC ASSISTED LAPAROSCOPIC RADICAL PROSTATECTOMY;  Surgeon: Lucas Mallow, MD;  Location: [facility];  Service: Urology;  Laterality: N/A;   UPPER GI ENDOSCOPY      FAMILY HISTORY:  Family History  Problem Relation Age of Onset   Colon cancer Mother    Prostate cancer Maternal Uncle    Prostate cancer Maternal Uncle    Pancreatic cancer Neg Hx    Breast cancer Neg Hx     SOCIAL HISTORY:  Social History   Socioeconomic History   Marital status: Married    Spouse name: Aiiden Darga   Number of children: Not on file   Years of education: Not on file   Highest education level: Not on file  Occupational History    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 [employer]  Tobacco Use   Smoking status: Current Every Day Smoker    Packs/day: 2.00  Years: 30.00    Pack years: 60.00    Types: Cigarettes   Smokeless tobacco: Never Used  Substance and Sexual Activity   Alcohol use: Yes    Alcohol/week: 20.0 standard drinks    Types: 20 Shots of liquor per week   Drug use: No   Sexual activity: Not Currently  Other Topics Concern   Not on file  Social History Narrative   Not on file   Social Determinants of Health   Financial Resource Strain:    Difficulty of Paying Living Expenses: Not on file  Food Insecurity:    Worried About  [place] in the Last Year: Not on file   Ran Out of Food in the Last Year: Not on file  Transportation Needs:    Lack of Transportation (Medical): Not on file   Lack of Transportation (Non-Medical): Not on file  Physical Activity:    Days of Exercise per Week: Not on file   Minutes of Exercise per Session: Not on file  Stress:    Feeling of Stress : Not on file  Social Connections:    Frequency of Communication with Friends and Family: Not on file   Frequency of Social Gatherings with Friends and Family: Not on file   Attends Religious Services: Not on file   Active Member of Clubs or Organizations: Not on file   Attends Archivist Meetings: Not on file   Marital Status: Not on file  Intimate Partner Violence:    Fear of Current or Ex-Partner: Not on file   Emotionally Abused: Not on file   Physically Abused: Not on file   Sexually Abused: Not on file    ALLERGIES: Statins  MEDICATIONS:  Current Outpatient Medications  Medication Sig Dispense Refill   acetaminophen (TYLENOL) 500 MG tablet Take 500-1,000 mg by mouth every 6 (six) hours as needed for mild pain or fever.      amLODipine (NORVASC) 5 MG tablet Take 5 mg by mouth daily.  0   folic acid (FOLVITE) 1 MG tablet Take 1 tablet (1 mg total) by mouth daily. 30 tablet 0   Garlic 123XX123 MG CAPS Take by mouth.     lisinopril (PRINIVIL,ZESTRIL) 10 MG tablet Take 10 mg by mouth daily.  1   Multiple Vitamin (MULTIVITAMIN WITH MINERALS) TABS tablet Take 1 tablet by mouth daily. 30 tablet 0   omeprazole (PRILOSEC OTC) 20 MG tablet Take 20 mg by mouth daily as needed (acid reflus symptoms).     Pancrelipase, Lip-Prot-Amyl, (ZENPEP) 20000-63000 units CPEP Take 1 capsule by mouth 3 (three) times daily before meals.     mirtazapine (REMERON) 15 MG tablet Take 15 mg by mouth at bedtime.     No current facility-administered medications for this encounter.    REVIEW OF SYSTEMS:  On review of  systems, the patient reports that he is doing well overall. He denies any chest pain, shortness of breath, cough, fevers, chills, night sweats, unintended weight changes. He denies any bowel disturbances, and denies abdominal pain, nausea or vomiting. He denies any new musculoskeletal or joint aches or pains. His IPSS was 5, indicating mild urinary symptoms. He reports nocturia x1-2 and occasional small volume SUI but in general, has regained full urinary continence and no longer wears pads for protection. His SHIM was 1, indicating he has severe erectile dysfunction. A complete review of systems is obtained and is otherwise negative.    PHYSICAL EXAM:  Wt Readings from Last 3  Encounters:  09/30/19 200 lb (90.7 kg)  01/01/18 166 lb (75.3 kg)  12/27/17 166 lb (75.3 kg)   Temp Readings from Last 3 Encounters:  10/01/19 97.9 F (36.6 C)  01/02/18 98.2 F (36.8 C) (Oral)  12/27/17 98.2 F (36.8 C) (Oral)   BP Readings from Last 3 Encounters:  10/01/19 (!) 146/94  01/02/18 (!) 90/57  12/27/17 120/79   Pulse Readings from Last 3 Encounters:  10/01/19 77  01/02/18 87  12/27/17 67   Pain Assessment Pain Score: 0-No pain/10  Physical exam not performed in light of telephone consult visit format.  KPS = 100  100 - Normal; no complaints; no evidence of disease. 90   - Able to carry on normal activity; minor signs or symptoms of disease. 80   - Normal activity with effort; some signs or symptoms of disease. 85   - Cares for self; unable to carry on normal activity or to do active work. 60   - Requires occasional assistance, but is able to care for most of his personal needs. 50   - Requires considerable assistance and frequent medical care. 58   - Disabled; requires special care and assistance. 64   - Severely disabled; hospital admission is indicated although death not imminent. 76   - Very sick; hospital admission necessary; active supportive treatment necessary. 10   - Moribund;  fatal processes progressing rapidly. 0     - Dead  Karnofsky DA, Abelmann [place], Craver LS and [facility] [phone]) The use of the nitrogen mustards in the palliative treatment of carcinoma: with particular reference to bronchogenic carcinoma Cancer 1 634-56  LABORATORY DATA:  Lab Results  Component Value Date   WBC 11.1 (H) 10/01/2019   HGB 13.4 10/01/2019   HCT 35.8 (L) 10/01/2019   MCV 96.8 10/01/2019   PLT 267 10/01/2019   Lab Results  Component Value Date   NA 143 10/01/2019   K 3.1 (L) 10/01/2019   CL 111 10/01/2019   CO2 23 10/01/2019   Lab Results  Component Value Date   ALT 27 09/30/2019   AST 36 09/30/2019   ALKPHOS 68 09/30/2019   BILITOT 0.8 09/30/2019     RADIOGRAPHY: No results found.    IMPRESSION/PLAN: This visit was conducted via Telephone to spare the patient unnecessary potential exposure in the healthcare setting during the current COVID-19 pandemic. 1. 64 y.o. gentleman with rising, detectable PSA s/p RALP in 12/2016 for Stage pT3a, pN0 Gleason 4+4 prostate cancerrising, detectable PSA, at 0.039 s/p RALP in 12/2016 for Stage pT3a, pN0 Gleason 4+4 prostate cancer  Today we reviewed the findings and workup thus far.  We discussed the natural history of prostate cancer.  We reviewed the the implications of positive margins, extracapsular extension, and seminal vesicle involvement on the risk of prostate cancer recurrence, particularly in light of rising, detectable postoperative PSA. We reviewed some of the evidence suggesting an advantage for patients who undergo early salvage radiotherapy with an improvement in disease-free survival as well as overall survival. We discussed a 7.5 week course of daily radiation treatment directed to the prostatic fossa with regard to the logistics and delivery of external beam radiation treatment.  He was encouraged to ask questions that were answered to his stated satisfaction.  At the end of the conversation the patient is  interested in moving forward with 7.5 weeks of salvage external beam therapy in May 2021, when he returns from taking care of his father's estate. He reports he  is scheduled for repeat PSA with Dr. Gloriann Loan in late April and a follow-up office visit with him the first week of May. We will share our discussion with Dr. Gloriann Loan and move forward with treatment planning in anticipation of beginning IMRT in early May 2021.    Given current concerns for patient exposure during the COVID-19 pandemic, this encounter was conducted via telephone. The patient was notified in advance and was offered a MyChart meeting to allow for face to face communication but unfortunately reported that he did not have the appropriate resources/technology to support such a visit and instead preferred to proceed with telephone consult. The patient has given verbal consent for this type of encounter. The time spent during this encounter was 45 minutes. The attendants for this meeting include Tyler Pita MD, Ashlyn Bruning PA-C, Katie [place], patient, Jihad Duffie and his wife. During the encounter, Tyler Pita MD, Ashlyn Bruning PA-C, and scribe, Wilburn Mylar were located at [place].  Patient, Aydyn Lizza and his wife were located at home.    Nicholos Johns, PA-C    Tyler Pita, MD  [facility] Direct Dial: [phone]   Fax: [phone] .com   Skype   [email or web address]  This document serves as a record of services personally performed by Tyler Pita, MD and Freeman Caldron, PA-C. It was created on their behalf by Wilburn Mylar, a trained medical scribe. The creation of this record is based on the scribe's personal observations and the provider's statements to them. This document has been checked and approved by the attending provider.

## 2019-12-04 ENCOUNTER — Telehealth: Payer: Self-pay | Admitting: *Deleted

## 2019-12-04 NOTE — Telephone Encounter (Signed)
Called patient to inform of sim appt. for May 7 @ 10 am @ Dr. Johny Shears Office, lvm for a return call

## 2020-02-13 ENCOUNTER — Telehealth: Payer: Self-pay | Admitting: *Deleted

## 2020-02-13 ENCOUNTER — Telehealth: Payer: Self-pay | Admitting: Radiation Oncology

## 2020-02-13 NOTE — Telephone Encounter (Signed)
Received voicemail message from patient questions return call. Phoned patient back. Patient and wife had questions about simulation appointment scheduled for tomorrow. Answered all patient questions to the best of my ability. Both patient and wife verbalized understanding of all reviewed.

## 2020-02-13 NOTE — Telephone Encounter (Signed)
Called patient to remind of sim appt. for 02-14-20 - arrival time- 9:15 am @ [facility], spoke with patient's wife- Jesse Lamb and she is aware of this appt.

## 2020-02-14 ENCOUNTER — Ambulatory Visit
Admission: RE | Admit: 2020-02-14 | Discharge: 2020-02-14 | Disposition: A | Discharge status: Home / Self Care | Payer: BLUE CROSS/BLUE SHIELD | Source: Ambulatory Visit | Attending: Radiation Oncology | Admitting: Radiation Oncology

## 2020-02-14 ENCOUNTER — Encounter: Payer: Self-pay | Admitting: Medical Oncology

## 2020-02-14 ENCOUNTER — Encounter: Payer: Self-pay | Admitting: Urology

## 2020-02-14 ENCOUNTER — Other Ambulatory Visit: Payer: Self-pay

## 2020-02-14 DIAGNOSIS — C61 Malignant neoplasm of prostate: Secondary | ICD-10-CM | POA: Diagnosis present

## 2020-02-14 DIAGNOSIS — Z510 Encounter for antineoplastic radiation therapy: Secondary | ICD-10-CM | POA: Insufficient documentation

## 2020-02-14 NOTE — Progress Notes (Signed)
Follow up PSA 02/06/20 was 0.13. Ready to proceed with salvage fossa XRT.  Nicholos Johns, MMS, PA-C [facility] [place]: [phone]  Fax: [phone]

## 2020-02-15 NOTE — Progress Notes (Signed)
  Radiation Oncology         [phone] ________________________________  Name: Jesse Lamb MRN: [id number]  Date: 02/14/2020  DOB: 03/21/1956  SIMULATION AND TREATMENT PLANNING NOTE    ICD-10-CM   1. Malignant neoplasm of prostate ([place])  C61     DIAGNOSIS:  64 y.o. gentleman with rising, detectable PSA, at 0.039 s/p RALP in 12/2016 for Stage pT3a, pN0 Gleason 4+4 prostate cancer  NARRATIVE:  The patient was brought to the [facility].  Identity was confirmed.  All relevant records and images related to the planned course of therapy were reviewed.  The patient freely provided informed written consent to proceed with treatment after reviewing the details related to the planned course of therapy. The consent form was witnessed and verified by the simulation staff.  Then, the patient was set-up in a stable reproducible supine position for radiation therapy.  A vacuum lock pillow device was custom fabricated to position his legs in a reproducible immobilized position.  Then, I performed a urethrogram under sterile conditions to identify the prostatic bed.  CT images were obtained.  Surface markings were placed.  The CT images were loaded into the planning software.  Then the prostate bed target, pelvic lymph node target and avoidance structures including the rectum, bladder, bowel and hips were contoured.  Treatment planning then occurred.  The radiation prescription was entered and confirmed.  A total of one complex treatment devices were fabricated. I have requested : Intensity Modulated Radiotherapy (IMRT) is medically necessary for this case for the following reason:  Rectal sparing.Marland Kitchen  PLAN:  The patient will receive 45 Gy in 25 fractions of 1.8 Gy, followed by a boost to the prostate bed to a total dose of 68.4 Gy with 13 additional fractions of 1.8 Gy.   ________________________________  Jesse Lamb, M.D.

## 2020-02-17 DIAGNOSIS — C61 Malignant neoplasm of prostate: Secondary | ICD-10-CM | POA: Diagnosis not present

## 2020-02-25 ENCOUNTER — Encounter: Payer: Self-pay | Admitting: Medical Oncology

## 2020-02-25 ENCOUNTER — Other Ambulatory Visit: Payer: Self-pay

## 2020-02-25 ENCOUNTER — Ambulatory Visit
Admission: RE | Admit: 2020-02-25 | Discharge: 2020-02-25 | Disposition: A | Discharge status: Home / Self Care | Payer: BLUE CROSS/BLUE SHIELD | Source: Ambulatory Visit | Attending: Radiation Oncology | Admitting: Radiation Oncology

## 2020-02-25 DIAGNOSIS — C61 Malignant neoplasm of prostate: Secondary | ICD-10-CM | POA: Diagnosis not present

## 2020-02-26 ENCOUNTER — Ambulatory Visit
Admission: RE | Admit: 2020-02-26 | Discharge: 2020-02-26 | Disposition: A | Discharge status: Home / Self Care | Payer: BLUE CROSS/BLUE SHIELD | Source: Ambulatory Visit | Attending: Radiation Oncology | Admitting: Radiation Oncology

## 2020-02-26 DIAGNOSIS — C61 Malignant neoplasm of prostate: Secondary | ICD-10-CM | POA: Diagnosis not present

## 2020-02-27 ENCOUNTER — Ambulatory Visit
Admission: RE | Admit: 2020-02-27 | Discharge: 2020-02-27 | Disposition: A | Discharge status: Home / Self Care | Payer: BLUE CROSS/BLUE SHIELD | Source: Ambulatory Visit | Attending: Radiation Oncology | Admitting: Radiation Oncology

## 2020-02-27 ENCOUNTER — Other Ambulatory Visit: Payer: Self-pay

## 2020-02-27 DIAGNOSIS — C61 Malignant neoplasm of prostate: Secondary | ICD-10-CM | POA: Diagnosis not present

## 2020-02-28 ENCOUNTER — Ambulatory Visit
Admission: RE | Admit: 2020-02-28 | Discharge: 2020-02-28 | Disposition: A | Discharge status: Home / Self Care | Payer: BLUE CROSS/BLUE SHIELD | Source: Ambulatory Visit | Attending: Radiation Oncology | Admitting: Radiation Oncology

## 2020-02-28 ENCOUNTER — Other Ambulatory Visit: Payer: Self-pay

## 2020-02-28 DIAGNOSIS — C61 Malignant neoplasm of prostate: Secondary | ICD-10-CM | POA: Diagnosis not present

## 2020-03-02 ENCOUNTER — Ambulatory Visit
Admission: RE | Admit: 2020-03-02 | Discharge: 2020-03-02 | Disposition: A | Discharge status: Home / Self Care | Payer: BLUE CROSS/BLUE SHIELD | Source: Ambulatory Visit | Attending: Radiation Oncology | Admitting: Radiation Oncology

## 2020-03-02 ENCOUNTER — Other Ambulatory Visit: Payer: Self-pay

## 2020-03-02 DIAGNOSIS — C61 Malignant neoplasm of prostate: Secondary | ICD-10-CM | POA: Diagnosis not present

## 2020-03-03 ENCOUNTER — Ambulatory Visit
Admission: RE | Admit: 2020-03-03 | Discharge: 2020-03-03 | Disposition: A | Discharge status: Home / Self Care | Payer: BLUE CROSS/BLUE SHIELD | Source: Ambulatory Visit | Attending: Radiation Oncology | Admitting: Radiation Oncology

## 2020-03-03 ENCOUNTER — Other Ambulatory Visit: Payer: Self-pay

## 2020-03-03 DIAGNOSIS — C61 Malignant neoplasm of prostate: Secondary | ICD-10-CM | POA: Diagnosis not present

## 2020-03-04 ENCOUNTER — Ambulatory Visit
Admission: RE | Admit: 2020-03-04 | Discharge: 2020-03-04 | Disposition: A | Discharge status: Home / Self Care | Payer: BLUE CROSS/BLUE SHIELD | Source: Ambulatory Visit | Attending: Radiation Oncology | Admitting: Radiation Oncology

## 2020-03-04 ENCOUNTER — Other Ambulatory Visit: Payer: Self-pay

## 2020-03-04 DIAGNOSIS — C61 Malignant neoplasm of prostate: Secondary | ICD-10-CM | POA: Diagnosis not present

## 2020-03-05 ENCOUNTER — Ambulatory Visit
Admission: RE | Admit: 2020-03-05 | Discharge: 2020-03-05 | Disposition: A | Discharge status: Home / Self Care | Payer: BLUE CROSS/BLUE SHIELD | Source: Ambulatory Visit | Attending: Radiation Oncology | Admitting: Radiation Oncology

## 2020-03-05 ENCOUNTER — Other Ambulatory Visit: Payer: Self-pay

## 2020-03-05 DIAGNOSIS — C61 Malignant neoplasm of prostate: Secondary | ICD-10-CM | POA: Diagnosis not present

## 2020-03-06 ENCOUNTER — Other Ambulatory Visit: Payer: Self-pay

## 2020-03-06 ENCOUNTER — Ambulatory Visit
Admission: RE | Admit: 2020-03-06 | Discharge: 2020-03-06 | Disposition: A | Discharge status: Home / Self Care | Payer: BLUE CROSS/BLUE SHIELD | Source: Ambulatory Visit | Attending: Radiation Oncology | Admitting: Radiation Oncology

## 2020-03-06 DIAGNOSIS — C61 Malignant neoplasm of prostate: Secondary | ICD-10-CM | POA: Diagnosis not present

## 2020-03-10 ENCOUNTER — Ambulatory Visit
Admission: RE | Admit: 2020-03-10 | Discharge: 2020-03-10 | Disposition: A | Discharge status: Home / Self Care | Payer: BLUE CROSS/BLUE SHIELD | Source: Ambulatory Visit | Attending: Radiation Oncology | Admitting: Radiation Oncology

## 2020-03-10 DIAGNOSIS — C61 Malignant neoplasm of prostate: Secondary | ICD-10-CM | POA: Insufficient documentation

## 2020-03-10 DIAGNOSIS — Z510 Encounter for antineoplastic radiation therapy: Secondary | ICD-10-CM | POA: Insufficient documentation

## 2020-03-11 ENCOUNTER — Other Ambulatory Visit: Payer: Self-pay

## 2020-03-11 ENCOUNTER — Ambulatory Visit
Admission: RE | Admit: 2020-03-11 | Discharge: 2020-03-11 | Disposition: A | Discharge status: Home / Self Care | Payer: BLUE CROSS/BLUE SHIELD | Source: Ambulatory Visit | Attending: Radiation Oncology | Admitting: Radiation Oncology

## 2020-03-11 DIAGNOSIS — C61 Malignant neoplasm of prostate: Secondary | ICD-10-CM | POA: Diagnosis not present

## 2020-03-12 ENCOUNTER — Other Ambulatory Visit: Payer: Self-pay

## 2020-03-12 ENCOUNTER — Ambulatory Visit
Admission: RE | Admit: 2020-03-12 | Discharge: 2020-03-12 | Disposition: A | Discharge status: Home / Self Care | Payer: BLUE CROSS/BLUE SHIELD | Source: Ambulatory Visit | Attending: Radiation Oncology | Admitting: Radiation Oncology

## 2020-03-12 DIAGNOSIS — C61 Malignant neoplasm of prostate: Secondary | ICD-10-CM | POA: Diagnosis not present

## 2020-03-13 ENCOUNTER — Other Ambulatory Visit: Payer: Self-pay

## 2020-03-13 ENCOUNTER — Ambulatory Visit
Admission: RE | Admit: 2020-03-13 | Discharge: 2020-03-13 | Disposition: A | Discharge status: Home / Self Care | Payer: BLUE CROSS/BLUE SHIELD | Source: Ambulatory Visit | Attending: Radiation Oncology | Admitting: Radiation Oncology

## 2020-03-13 DIAGNOSIS — C61 Malignant neoplasm of prostate: Secondary | ICD-10-CM | POA: Diagnosis not present

## 2020-03-16 ENCOUNTER — Other Ambulatory Visit: Payer: Self-pay

## 2020-03-16 ENCOUNTER — Ambulatory Visit
Admission: RE | Admit: 2020-03-16 | Discharge: 2020-03-16 | Disposition: A | Discharge status: Home / Self Care | Payer: BLUE CROSS/BLUE SHIELD | Source: Ambulatory Visit | Attending: Radiation Oncology | Admitting: Radiation Oncology

## 2020-03-16 DIAGNOSIS — C61 Malignant neoplasm of prostate: Secondary | ICD-10-CM | POA: Diagnosis not present

## 2020-03-17 ENCOUNTER — Ambulatory Visit
Admission: RE | Admit: 2020-03-17 | Discharge: 2020-03-17 | Disposition: A | Discharge status: Home / Self Care | Payer: BLUE CROSS/BLUE SHIELD | Source: Ambulatory Visit | Attending: Radiation Oncology | Admitting: Radiation Oncology

## 2020-03-17 ENCOUNTER — Other Ambulatory Visit: Payer: Self-pay

## 2020-03-17 DIAGNOSIS — C61 Malignant neoplasm of prostate: Secondary | ICD-10-CM | POA: Diagnosis not present

## 2020-03-18 ENCOUNTER — Ambulatory Visit
Admission: RE | Admit: 2020-03-18 | Discharge: 2020-03-18 | Disposition: A | Discharge status: Home / Self Care | Payer: BLUE CROSS/BLUE SHIELD | Source: Ambulatory Visit | Attending: Radiation Oncology | Admitting: Radiation Oncology

## 2020-03-18 ENCOUNTER — Other Ambulatory Visit: Payer: Self-pay

## 2020-03-18 DIAGNOSIS — C61 Malignant neoplasm of prostate: Secondary | ICD-10-CM | POA: Diagnosis not present

## 2020-03-19 ENCOUNTER — Other Ambulatory Visit: Payer: Self-pay

## 2020-03-19 ENCOUNTER — Ambulatory Visit
Admission: RE | Admit: 2020-03-19 | Discharge: 2020-03-19 | Disposition: A | Discharge status: Home / Self Care | Payer: BLUE CROSS/BLUE SHIELD | Source: Ambulatory Visit | Attending: Radiation Oncology | Admitting: Radiation Oncology

## 2020-03-19 DIAGNOSIS — C61 Malignant neoplasm of prostate: Secondary | ICD-10-CM | POA: Diagnosis not present

## 2020-03-20 ENCOUNTER — Ambulatory Visit
Admission: RE | Admit: 2020-03-20 | Discharge: 2020-03-20 | Disposition: A | Discharge status: Home / Self Care | Payer: BLUE CROSS/BLUE SHIELD | Source: Ambulatory Visit | Attending: Radiation Oncology | Admitting: Radiation Oncology

## 2020-03-20 ENCOUNTER — Other Ambulatory Visit: Payer: Self-pay

## 2020-03-20 DIAGNOSIS — C61 Malignant neoplasm of prostate: Secondary | ICD-10-CM | POA: Diagnosis not present

## 2020-03-23 ENCOUNTER — Other Ambulatory Visit: Payer: Self-pay

## 2020-03-23 ENCOUNTER — Ambulatory Visit
Admission: RE | Admit: 2020-03-23 | Discharge: 2020-03-23 | Disposition: A | Discharge status: Home / Self Care | Payer: BLUE CROSS/BLUE SHIELD | Source: Ambulatory Visit | Attending: Radiation Oncology | Admitting: Radiation Oncology

## 2020-03-23 DIAGNOSIS — C61 Malignant neoplasm of prostate: Secondary | ICD-10-CM | POA: Diagnosis not present

## 2020-03-24 ENCOUNTER — Ambulatory Visit
Admission: RE | Admit: 2020-03-24 | Discharge: 2020-03-24 | Disposition: A | Discharge status: Home / Self Care | Payer: BLUE CROSS/BLUE SHIELD | Source: Ambulatory Visit | Attending: Radiation Oncology | Admitting: Radiation Oncology

## 2020-03-24 ENCOUNTER — Other Ambulatory Visit: Payer: Self-pay

## 2020-03-24 DIAGNOSIS — C61 Malignant neoplasm of prostate: Secondary | ICD-10-CM | POA: Diagnosis not present

## 2020-03-25 ENCOUNTER — Other Ambulatory Visit: Payer: Self-pay

## 2020-03-25 ENCOUNTER — Ambulatory Visit
Admission: RE | Admit: 2020-03-25 | Discharge: 2020-03-25 | Disposition: A | Discharge status: Home / Self Care | Payer: BLUE CROSS/BLUE SHIELD | Source: Ambulatory Visit | Attending: Radiation Oncology | Admitting: Radiation Oncology

## 2020-03-25 DIAGNOSIS — C61 Malignant neoplasm of prostate: Secondary | ICD-10-CM | POA: Diagnosis not present

## 2020-03-26 ENCOUNTER — Other Ambulatory Visit: Payer: Self-pay

## 2020-03-26 ENCOUNTER — Ambulatory Visit
Admission: RE | Admit: 2020-03-26 | Discharge: 2020-03-26 | Disposition: A | Discharge status: Home / Self Care | Payer: BLUE CROSS/BLUE SHIELD | Source: Ambulatory Visit | Attending: Radiation Oncology | Admitting: Radiation Oncology

## 2020-03-26 DIAGNOSIS — C61 Malignant neoplasm of prostate: Secondary | ICD-10-CM | POA: Diagnosis not present

## 2020-03-27 ENCOUNTER — Ambulatory Visit
Admission: RE | Admit: 2020-03-27 | Discharge: 2020-03-27 | Disposition: A | Discharge status: Home / Self Care | Payer: BLUE CROSS/BLUE SHIELD | Source: Ambulatory Visit | Attending: Radiation Oncology | Admitting: Radiation Oncology

## 2020-03-27 ENCOUNTER — Other Ambulatory Visit: Payer: Self-pay

## 2020-03-27 DIAGNOSIS — C61 Malignant neoplasm of prostate: Secondary | ICD-10-CM | POA: Diagnosis not present

## 2020-03-30 ENCOUNTER — Ambulatory Visit
Admission: RE | Admit: 2020-03-30 | Discharge: 2020-03-30 | Disposition: A | Discharge status: Home / Self Care | Payer: BLUE CROSS/BLUE SHIELD | Source: Ambulatory Visit | Attending: Radiation Oncology | Admitting: Radiation Oncology

## 2020-03-30 ENCOUNTER — Other Ambulatory Visit: Payer: Self-pay

## 2020-03-30 DIAGNOSIS — C61 Malignant neoplasm of prostate: Secondary | ICD-10-CM | POA: Diagnosis not present

## 2020-03-31 ENCOUNTER — Ambulatory Visit
Admission: RE | Admit: 2020-03-31 | Discharge: 2020-03-31 | Disposition: A | Discharge status: Home / Self Care | Payer: BLUE CROSS/BLUE SHIELD | Source: Ambulatory Visit | Attending: Radiation Oncology | Admitting: Radiation Oncology

## 2020-03-31 ENCOUNTER — Other Ambulatory Visit: Payer: Self-pay

## 2020-03-31 DIAGNOSIS — C61 Malignant neoplasm of prostate: Secondary | ICD-10-CM | POA: Diagnosis not present

## 2020-04-01 ENCOUNTER — Ambulatory Visit
Admission: RE | Admit: 2020-04-01 | Discharge: 2020-04-01 | Disposition: A | Discharge status: Home / Self Care | Payer: BLUE CROSS/BLUE SHIELD | Source: Ambulatory Visit | Attending: Radiation Oncology | Admitting: Radiation Oncology

## 2020-04-01 ENCOUNTER — Other Ambulatory Visit: Payer: Self-pay

## 2020-04-01 DIAGNOSIS — C61 Malignant neoplasm of prostate: Secondary | ICD-10-CM | POA: Diagnosis not present

## 2020-04-02 ENCOUNTER — Encounter: Payer: Self-pay | Admitting: Medical Oncology

## 2020-04-02 ENCOUNTER — Other Ambulatory Visit: Payer: Self-pay

## 2020-04-02 ENCOUNTER — Ambulatory Visit
Admission: RE | Admit: 2020-04-02 | Discharge: 2020-04-02 | Disposition: A | Discharge status: Home / Self Care | Payer: BLUE CROSS/BLUE SHIELD | Source: Ambulatory Visit | Attending: Radiation Oncology | Admitting: Radiation Oncology

## 2020-04-02 DIAGNOSIS — C61 Malignant neoplasm of prostate: Secondary | ICD-10-CM | POA: Diagnosis not present

## 2020-04-03 ENCOUNTER — Other Ambulatory Visit: Payer: Self-pay

## 2020-04-03 ENCOUNTER — Ambulatory Visit
Admission: RE | Admit: 2020-04-03 | Discharge: 2020-04-03 | Disposition: A | Discharge status: Home / Self Care | Payer: BLUE CROSS/BLUE SHIELD | Source: Ambulatory Visit | Attending: Radiation Oncology | Admitting: Radiation Oncology

## 2020-04-03 DIAGNOSIS — C61 Malignant neoplasm of prostate: Secondary | ICD-10-CM | POA: Diagnosis not present

## 2020-04-06 ENCOUNTER — Other Ambulatory Visit: Payer: Self-pay

## 2020-04-06 ENCOUNTER — Ambulatory Visit
Admission: RE | Admit: 2020-04-06 | Discharge: 2020-04-06 | Disposition: A | Discharge status: Home / Self Care | Payer: BLUE CROSS/BLUE SHIELD | Source: Ambulatory Visit | Attending: Radiation Oncology | Admitting: Radiation Oncology

## 2020-04-06 DIAGNOSIS — C61 Malignant neoplasm of prostate: Secondary | ICD-10-CM | POA: Diagnosis not present

## 2020-04-07 ENCOUNTER — Ambulatory Visit
Admission: RE | Admit: 2020-04-07 | Discharge: 2020-04-07 | Disposition: A | Discharge status: Home / Self Care | Payer: BLUE CROSS/BLUE SHIELD | Source: Ambulatory Visit | Attending: Radiation Oncology | Admitting: Radiation Oncology

## 2020-04-07 DIAGNOSIS — C61 Malignant neoplasm of prostate: Secondary | ICD-10-CM | POA: Diagnosis not present

## 2020-04-08 ENCOUNTER — Ambulatory Visit
Admission: RE | Admit: 2020-04-08 | Discharge: 2020-04-08 | Disposition: A | Discharge status: Home / Self Care | Payer: BLUE CROSS/BLUE SHIELD | Source: Ambulatory Visit | Attending: Radiation Oncology | Admitting: Radiation Oncology

## 2020-04-08 ENCOUNTER — Other Ambulatory Visit: Payer: Self-pay

## 2020-04-08 DIAGNOSIS — C61 Malignant neoplasm of prostate: Secondary | ICD-10-CM | POA: Diagnosis not present

## 2020-04-09 ENCOUNTER — Other Ambulatory Visit: Payer: Self-pay

## 2020-04-09 ENCOUNTER — Ambulatory Visit
Admission: RE | Admit: 2020-04-09 | Discharge: 2020-04-09 | Disposition: A | Discharge status: Home / Self Care | Payer: BLUE CROSS/BLUE SHIELD | Source: Ambulatory Visit | Attending: Radiation Oncology | Admitting: Radiation Oncology

## 2020-04-09 DIAGNOSIS — C61 Malignant neoplasm of prostate: Secondary | ICD-10-CM | POA: Insufficient documentation

## 2020-04-09 DIAGNOSIS — Z510 Encounter for antineoplastic radiation therapy: Secondary | ICD-10-CM | POA: Insufficient documentation

## 2020-04-10 ENCOUNTER — Ambulatory Visit
Admission: RE | Admit: 2020-04-10 | Discharge: 2020-04-10 | Disposition: A | Discharge status: Home / Self Care | Payer: BLUE CROSS/BLUE SHIELD | Source: Ambulatory Visit | Attending: Radiation Oncology | Admitting: Radiation Oncology

## 2020-04-10 ENCOUNTER — Other Ambulatory Visit: Payer: Self-pay

## 2020-04-10 DIAGNOSIS — C61 Malignant neoplasm of prostate: Secondary | ICD-10-CM | POA: Diagnosis not present

## 2020-04-14 ENCOUNTER — Ambulatory Visit
Admission: RE | Admit: 2020-04-14 | Discharge: 2020-04-14 | Disposition: A | Discharge status: Home / Self Care | Payer: BLUE CROSS/BLUE SHIELD | Source: Ambulatory Visit | Attending: Radiation Oncology | Admitting: Radiation Oncology

## 2020-04-14 ENCOUNTER — Other Ambulatory Visit: Payer: Self-pay

## 2020-04-14 DIAGNOSIS — C61 Malignant neoplasm of prostate: Secondary | ICD-10-CM | POA: Diagnosis not present

## 2020-04-15 ENCOUNTER — Other Ambulatory Visit: Payer: Self-pay

## 2020-04-15 ENCOUNTER — Ambulatory Visit
Admission: RE | Admit: 2020-04-15 | Discharge: 2020-04-15 | Disposition: A | Discharge status: Home / Self Care | Payer: BLUE CROSS/BLUE SHIELD | Source: Ambulatory Visit | Attending: Radiation Oncology | Admitting: Radiation Oncology

## 2020-04-15 DIAGNOSIS — C61 Malignant neoplasm of prostate: Secondary | ICD-10-CM | POA: Diagnosis not present

## 2020-04-16 ENCOUNTER — Other Ambulatory Visit: Payer: Self-pay

## 2020-04-16 ENCOUNTER — Ambulatory Visit
Admission: RE | Admit: 2020-04-16 | Discharge: 2020-04-16 | Disposition: A | Discharge status: Home / Self Care | Payer: BLUE CROSS/BLUE SHIELD | Source: Ambulatory Visit | Attending: Radiation Oncology | Admitting: Radiation Oncology

## 2020-04-16 DIAGNOSIS — C61 Malignant neoplasm of prostate: Secondary | ICD-10-CM | POA: Diagnosis not present

## 2020-04-17 ENCOUNTER — Other Ambulatory Visit: Payer: Self-pay

## 2020-04-17 ENCOUNTER — Ambulatory Visit
Admission: RE | Admit: 2020-04-17 | Discharge: 2020-04-17 | Disposition: A | Discharge status: Home / Self Care | Payer: BLUE CROSS/BLUE SHIELD | Source: Ambulatory Visit | Attending: Radiation Oncology | Admitting: Radiation Oncology

## 2020-04-17 DIAGNOSIS — C61 Malignant neoplasm of prostate: Secondary | ICD-10-CM | POA: Diagnosis not present

## 2020-04-20 ENCOUNTER — Encounter: Payer: Self-pay | Admitting: Medical Oncology

## 2020-04-20 ENCOUNTER — Ambulatory Visit
Admission: RE | Admit: 2020-04-20 | Discharge: 2020-04-20 | Disposition: A | Discharge status: Home / Self Care | Payer: BLUE CROSS/BLUE SHIELD | Source: Ambulatory Visit | Attending: Radiation Oncology | Admitting: Radiation Oncology

## 2020-04-20 ENCOUNTER — Encounter: Payer: Self-pay | Admitting: Urology

## 2020-04-20 ENCOUNTER — Other Ambulatory Visit: Payer: Self-pay

## 2020-04-20 DIAGNOSIS — C61 Malignant neoplasm of prostate: Secondary | ICD-10-CM | POA: Diagnosis not present

## 2020-05-18 ENCOUNTER — Telehealth: Payer: Self-pay

## 2020-05-18 ENCOUNTER — Encounter: Payer: Self-pay | Admitting: Urology

## 2020-05-18 NOTE — Progress Notes (Signed)
Patient has 1 month telephone follow-up appointment with Ashlyn Bruning PA. Patient states nocturia 2-3 times per day. Patient states having regular bowel movements. Patients states urine stream is strong and steady. Patient states that he is emptying his bladder completely. Patient denies having urgency, hesitancy or straining. Patient states occasional leakage when coughing or sneezing. Patient states mild fatigue. Patient states having a follow-up appointment with [facility] in November.

## 2020-05-18 NOTE — Telephone Encounter (Signed)
Spoke with patient in regards to 1 month telephone follow-up appointment with Freeman Caldron PA on 05/21/20 at 1:00pm. Patient verbalized understanding of appointment date and time. Meaningful use, AUA and prostate questions were reviewed.

## 2020-05-21 ENCOUNTER — Other Ambulatory Visit: Payer: Self-pay

## 2020-05-21 ENCOUNTER — Ambulatory Visit
Admission: RE | Admit: 2020-05-21 | Discharge: 2020-05-21 | Disposition: A | Discharge status: Home / Self Care | Payer: BLUE CROSS/BLUE SHIELD | Source: Ambulatory Visit | Attending: Urology | Admitting: Urology

## 2020-05-21 DIAGNOSIS — C61 Malignant neoplasm of prostate: Secondary | ICD-10-CM

## 2020-05-21 NOTE — Progress Notes (Signed)
  Radiation Oncology         [phone]) [phone] ________________________________  Name: Jesse Lamb MRN: [id number]  Date: 04/20/2020  DOB: 10-15-1955  End of Treatment Note  Diagnosis:   64 y.o.gentleman with rising, detectable PSA, at 0.039s/p RALP in 12/2016 for StagepT3a, pN0Gleason4+4 prostate cancer     Indication for treatment:  Curative, Definitive Radiotherapy       Radiation treatment dates:   02/25/20 - 04/20/20   Site/dose:  1. The prostate fossa and pelvic lymph nodes were initially treated to 45 Gy in 25 fractions of 1.8 Gy  2. The prostate fossa only was boosted to 68.4 Gy with 13 additional fractions of 1.8 Gy   Beams/energy:  1. The prostate fossa  and pelvic lymph nodes were initially treated using VMAT intensity modulated radiotherapy delivering 6 megavolt photons. Image guidance was performed with CB-CT studies prior to each fraction. He was immobilized with a body fix lower extremity mold.  2. The prostate fossa only was boosted using VMAT intensity modulated radiotherapy delivering 6 megavolt photons. Image guidance was performed with CB-CT studies prior to each fraction. He was immobilized with a body fix lower extremity mold.  Narrative: The patient tolerated radiation treatment relatively well.   The patient experienced some minor urinary irritation and modest fatigue.   Plan: The patient has completed radiation treatment. He will return to radiation oncology clinic for routine followup in one month. I advised him to call or return sooner if he has any questions or concerns related to his recovery or treatment. ________________________________  Sheral Apley. Tammi Klippel, M.D.

## 2020-05-21 NOTE — Progress Notes (Signed)
Radiation Oncology         [phone] ________________________________  Name: Jesse Lamb MRN: [id number]  Date: 05/21/2020  DOB: 04/09/1956  Post Treatment Note  CC: Raelene Bott, MD  Raelene Bott, MD  Diagnosis:   64 y.o. gentleman with rising, detectable PSA, at 0.039 s/p RALP in 12/2016 for Stage pT3a, pN0 Gleason 4+4 prostate cancer  Interval Since Last Radiation:  4.5 weeks  02/25/20 - 04/20/20: 1. The prostate fossa and pelvic lymph nodes were initially treated to 45 Gy in 25 fractions of 1.8 Gy  2. The prostate fossa only was boosted to 68.4 Gy with 13 additional fractions of 1.8 Gy   Narrative:  I spoke with the patient to conduct his routine scheduled 1 month follow up visit via telephone to spare the patient unnecessary potential exposure in the healthcare setting during the current COVID-19 pandemic.  The patient was notified in advance and gave permission to proceed with this visit format. The patient tolerated his radiation treatments relatively well with only mild urinary symptoms and modest fatigue.                              On review of systems, the patient states that he is doing very well in general.  He continues with nocturia 2-3 times per day but urine stream is strong and steady and he feels that he is emptying his bladder completely on voiding. His current IPSS is 5, indicating mild urinary symptoms, consistent with his pre-treatment IPSS. Patient specifically denies having dysuria, gross hematuria, urgency, hesitancy or straining.  He does have occasional small-volume leakage when coughing or sneezing.  He denies abdominal pain, nausea, vomiting, diarrhea or constipation and reports having regular bowel movements.  He does have mild fatigue but has been able to remain active.  He reports a healthy appetite and is maintaining his weight.  Overall, he is quite pleased with his progress to date.  ALLERGIES:  is allergic to statins.  Meds: Current Outpatient  Medications  Medication Sig Dispense Refill  . acetaminophen (TYLENOL) 500 MG tablet Take 500-1,000 mg by mouth every 6 (six) hours as needed for mild pain or fever.     Marland Kitchen amLODipine (NORVASC) 5 MG tablet Take 5 mg by mouth daily.  0  . folic acid (FOLVITE) 1 MG tablet Take 1 tablet (1 mg total) by mouth daily. 30 tablet 0  . Garlic 8413 MG CAPS Take by mouth.    Marland Kitchen lisinopril (PRINIVIL,ZESTRIL) 10 MG tablet Take 10 mg by mouth daily.  1  . mirtazapine (REMERON) 15 MG tablet Take 15 mg by mouth at bedtime.    . Multiple Vitamin (MULTIVITAMIN WITH MINERALS) TABS tablet Take 1 tablet by mouth daily. 30 tablet 0  . omeprazole (PRILOSEC OTC) 20 MG tablet Take 20 mg by mouth daily as needed (acid reflus symptoms).    . Pancrelipase, Lip-Prot-Amyl, (ZENPEP) 20000-63000 units CPEP Take 1 capsule by mouth 3 (three) times daily before meals.     No current facility-administered medications for this encounter.    Physical Findings:  vitals were not taken for this visit.   /Unable to assess due to telephone follow-up visit format.  Lab Findings: Lab Results  Component Value Date   WBC 11.1 (H) 10/01/2019   HGB 13.4 10/01/2019   HCT 35.8 (L) 10/01/2019   MCV 96.8 10/01/2019   PLT 267 10/01/2019     Radiographic Findings: No results found.  Impression/Plan: 1. 64 y.o. gentleman with rising, detectable PSA, at 0.039 s/p RALP in 12/2016 for Stage pT3a, pN0 Gleason 4+4 prostate cancer. He will continue to follow up with urology for ongoing PSA determinations and has an appointment scheduled with Dr. Gloriann Loan on 07/29/20. He understands what to expect with regards to PSA monitoring going forward. I will look forward to following his response to treatment via correspondence with urology, and would be happy to continue to participate in his care if clinically indicated. I talked to the patient about what to expect in the future, including his risk for erectile dysfunction and rectal bleeding. I encouraged  him to call or return to the office if he has any questions regarding his previous radiation or possible radiation side effects. He was comfortable with this plan and will follow up as needed.  Today, a comprehensive survivorship care plan and treatment summary was reviewed with the patient today detailing his prostate cancer diagnosis, treatment course, potential late/long-term effects of treatment, appropriate follow-up care with recommendations for the future, and patient education resources.  A copy of this summary, along with a letter will be sent to the patient's primary care provider via fax after today's visit.  2. Cancer screening:  Due to Mr. Pizana's history and his age, he should receive screening for skin cancers, colon cancer, and lung cancer.  The information and recommendations are listed on the patient's comprehensive care plan/treatment summary and were reviewed in detail with the patient.     3. Health maintenance and wellness promotion: Mr. Sontag was encouraged to consume 5-7 servings of fruits and vegetables per day. He was provided a copy of the "Nutrition Rainbow" handout, as well as the handout "Take Control of Your Health and [place]" from the [place].  He was also encouraged to engage in moderate to vigorous exercise for 30 minutes per day most days of the week. Information was provided regarding the [facility] fitness program, which is designed for cancer survivors to help them become more physically fit after cancer treatments. We discussed that a healthy BMI is 18.5-24.9 and that maintaining a healthy weight reduces risk of cancer recurrences.  He was instructed to limit his alcohol consumption and was encouraged to stop smoking.  Lastly, he was encouraged to use sunscreen and wear protective clothing when in the sun.     4. Support services/counseling: It is not uncommon for this period of the patient's cancer care trajectory to be one of many  emotions and stressors.  Mr. Feasel was encouraged to take advantage of our many support services programs, support groups, and/or counseling in coping with his new life as a cancer survivor after completing anti-cancer treatment.  He was offered support today through active listening and expressive supportive counseling.  He was given information regarding our available services and encouraged to contact me with any questions or for help enrolling in any of our support group/programs.      Nicholos Johns, PA-C

## 2020-10-06 DIAGNOSIS — M79642 Pain in left hand: Secondary | ICD-10-CM | POA: Insufficient documentation

## 2020-10-06 DIAGNOSIS — M79641 Pain in right hand: Secondary | ICD-10-CM | POA: Insufficient documentation

## 2020-11-06 DIAGNOSIS — K580 Irritable bowel syndrome with diarrhea: Secondary | ICD-10-CM | POA: Insufficient documentation

## 2021-07-27 ENCOUNTER — Ambulatory Visit: Payer: Self-pay | Admitting: Surgery

## 2021-08-25 IMAGING — CT CT CERVICAL SPINE W/O CM
3 of 4 series · 9 of 33 positions shown, 10 images · non-contrast
Comparison: None.

CLINICAL DATA: Fall from bed with altered mental status

EXAM:
CT HEAD WITHOUT CONTRAST
CT CERVICAL SPINE WITHOUT CONTRAST
TECHNIQUE: Multidetector CT imaging of the head and cervical spine was
performed following the standard protocol without intravenous
contrast. Multiplanar CT image reconstructions of the cervical spine
were also generated.

[Series 4: sagittal bone · sagittal · 0.19mm/px · 5 of 67 slices shown]
[im 23/67  bone]
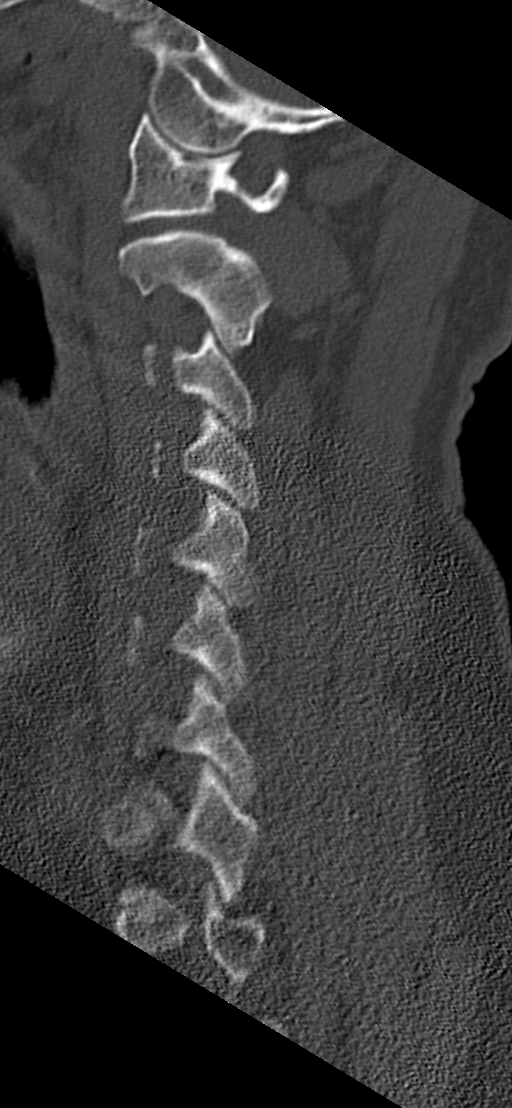
[im 28/67  bone]
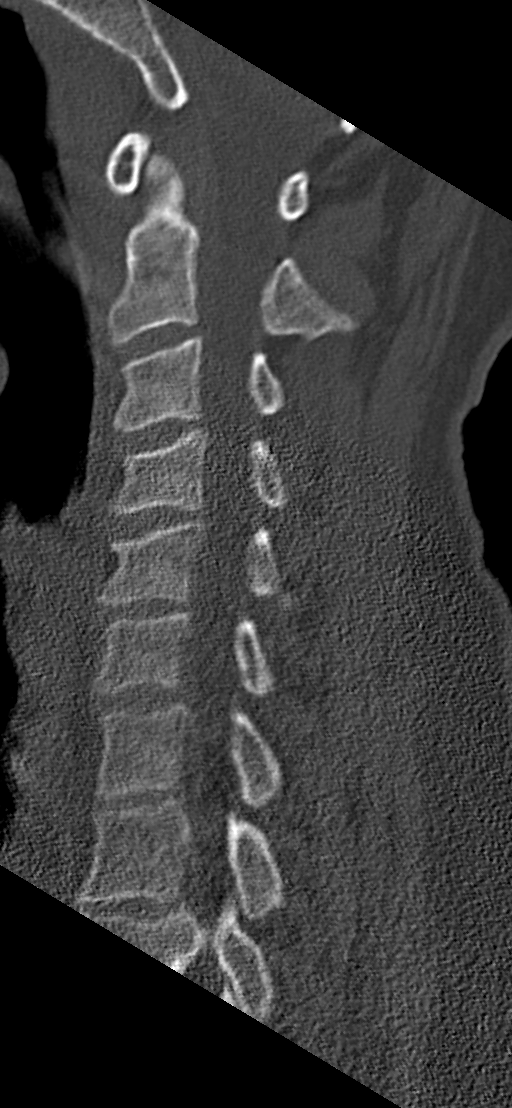
[im 34/67  bone]
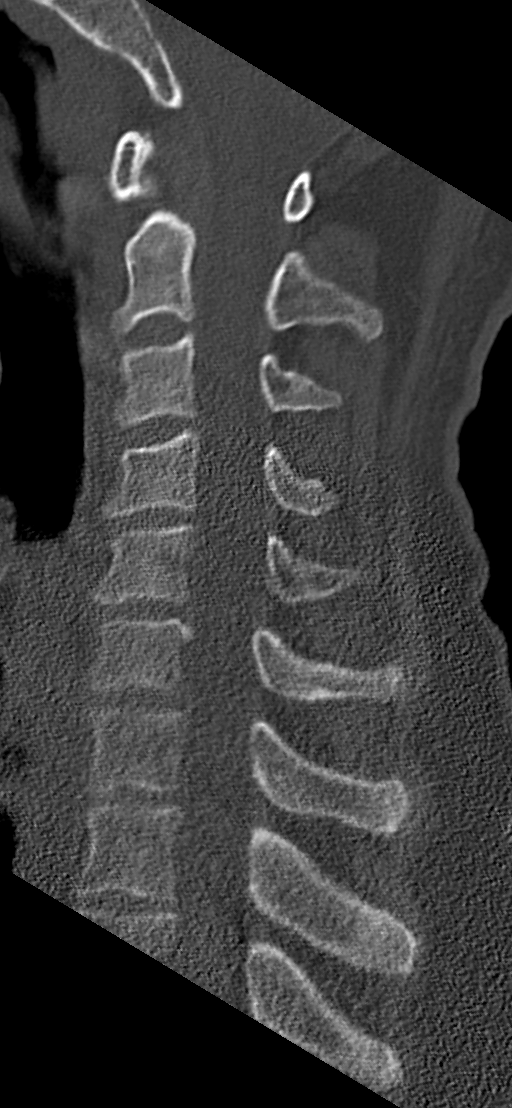
[im 39/67  bone]
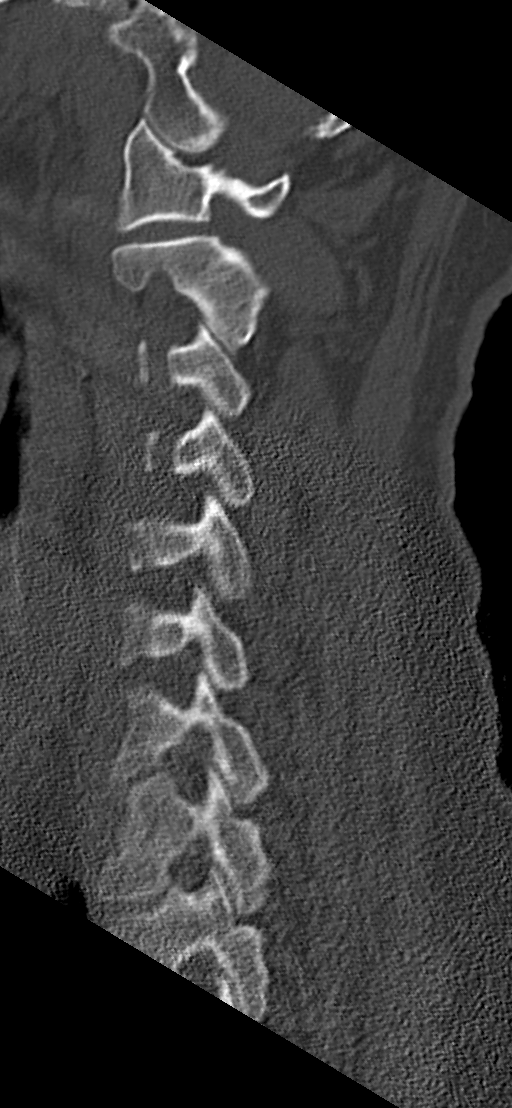
[im 45/67  bone]
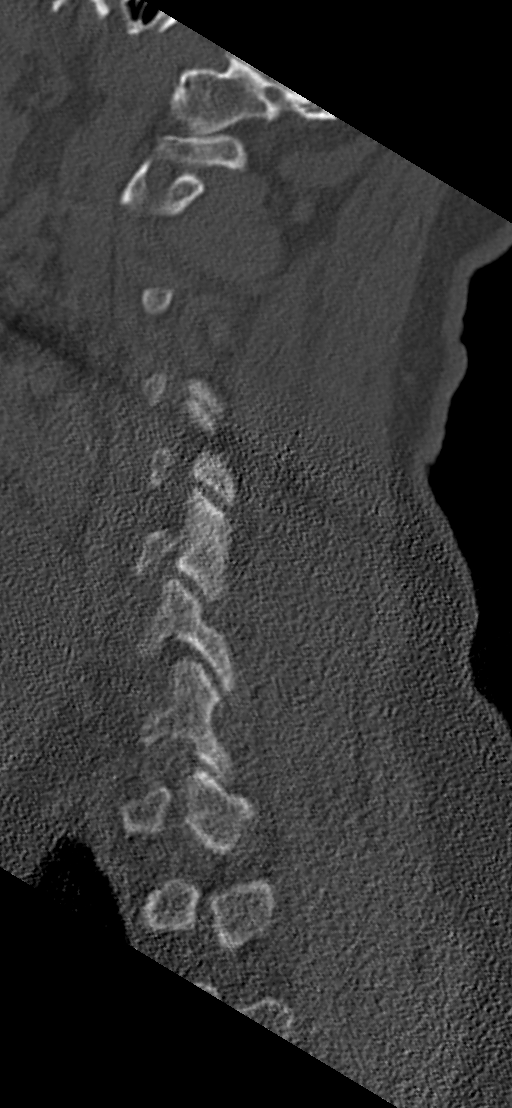

[Series 5: coronal bone · coronal · 0.22mm/px · 3 of 42 slices shown]
[im 9/42  bone]
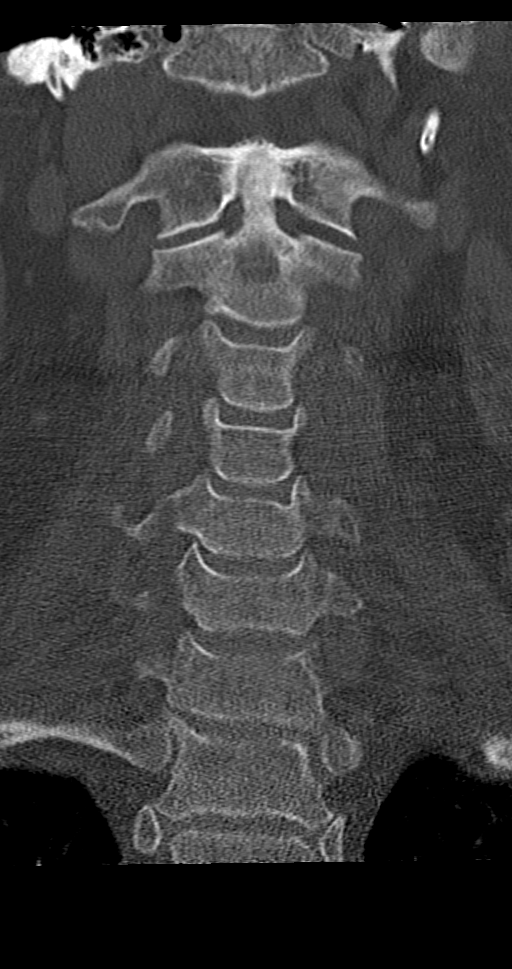
[im 17/42  bone]
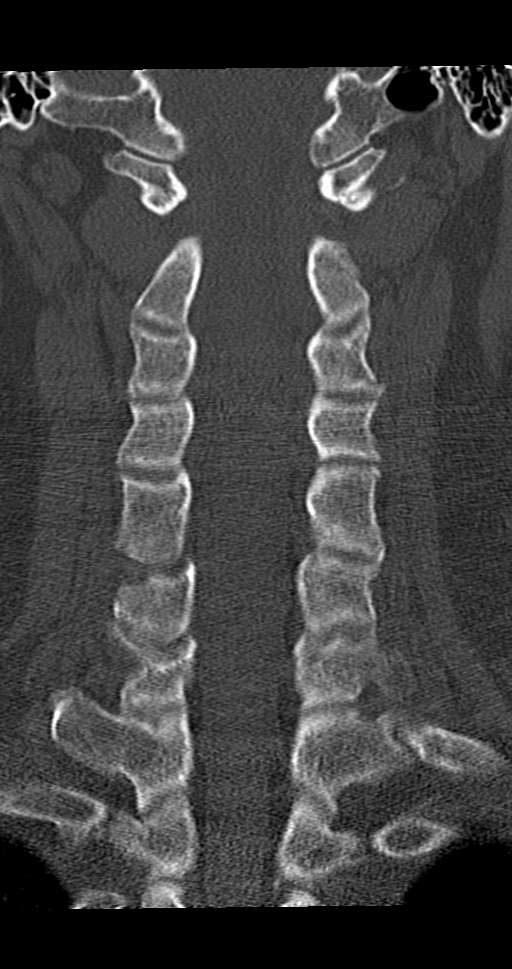
[im 25/42  bone]
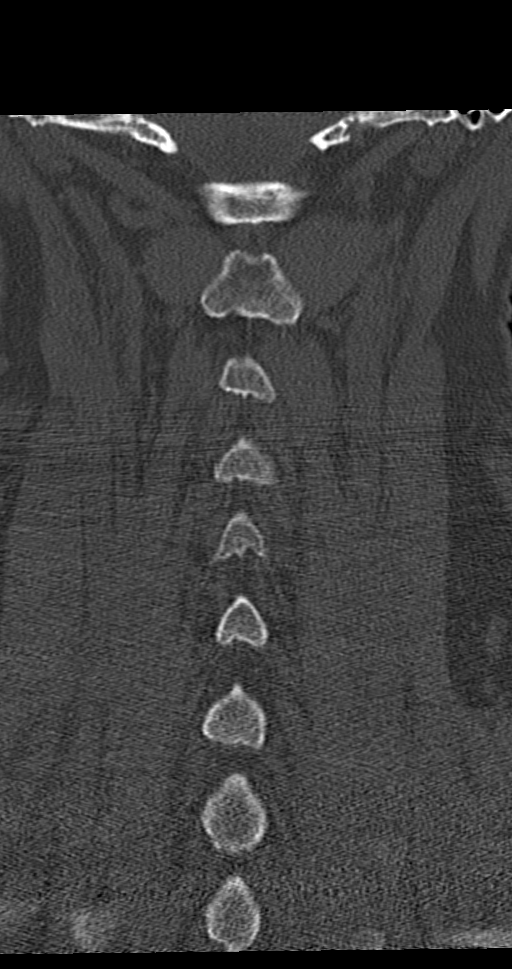

[Series 6: orthogonal bone · axial · 0.19mm/px · z∈[-210,-210]mm · 1 of 107 slices shown, 2 images]
[im 61/107  soft-tissue]
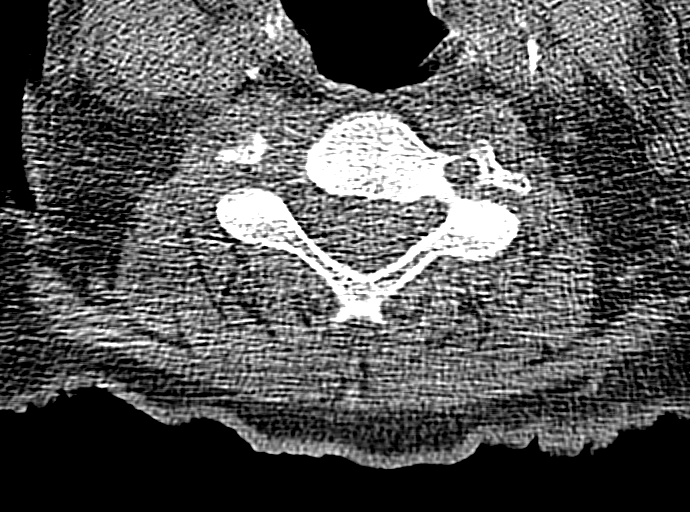
[im 61/107  bone]
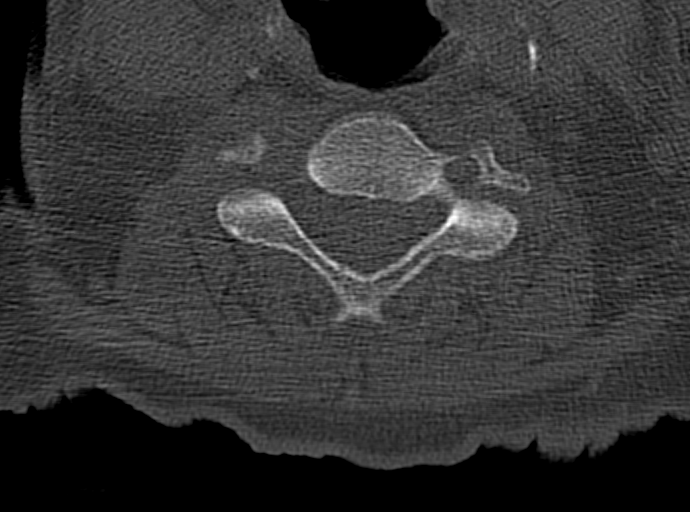

[9 of 33 positions shown; findings below may reference images not displayed]

FINDINGS: CT HEAD FINDINGS

Brain: No evidence of acute infarction, hemorrhage, hydrocephalus,
extra-axial collection or mass lesion/mass effect. Mild presumed
chronic small vessel ischemic change in the cerebral white matter.

Vascular: No hyperdense vessel or unexpected calcification.

Skull: Normal. Negative for fracture or focal lesion.

Sinuses/Orbits: No acute finding.

CT CERVICAL SPINE FINDINGS

Alignment: Normal

Skull base and vertebrae: Negative for fracture

Soft tissues and spinal canal: No prevertebral fluid or swelling. No
visible canal hematoma. 3.7 cm nodule in the right lobe thyroid
without evident invasive feature or adenopathy.

Disc levels:  No significant degenerative changes

Upper chest: Negative
IMPRESSION: 1. No evidence of acute intracranial or cervical spine injury.
[DATE] cm right thyroid nodule. Recommend thyroid US.(ref: [HOSPITAL]. [DATE]): 143-50).

## 2021-10-12 ENCOUNTER — Ambulatory Visit: Payer: Self-pay | Admitting: Surgery

## 2021-10-12 NOTE — Progress Notes (Addendum)
COVID swab appointment:  10-19-21 @ 8:00 AM  COVID Vaccine Completed: Yes x2 Date COVID Vaccine completed: 05-06-20 06-03-20 Has received booster:  Yes x1 12-16-20 COVID vaccine manufacturer:   Moderna     Date of COVID positive in last 90 days: No  PCP - Raelene Bott, MD Cardiologist - N/A  Chest x-ray - N/A EKG - 08-10-21 CEW.  Copy on chart Stress Test - N/A ECHO - N/A Cardiac Cath - N/A Pacemaker/ICD device last checked: Spinal Cord Stimulator:  Sleep Study - N/A CPAP -   Fasting Blood Sugar - N/A Checks Blood Sugar _____ times a day  Blood Thinner Instructions:N/A Aspirin Instructions: Last Dose:  Activity level:   Can go up a flight of stairs and perform activities of daily living without stopping and without symptoms of chest pain or shortness of breath.    Anesthesia review: N/A  Patient denies shortness of breath, fever, cough and chest pain at PAT appointment   Patient verbalized understanding of instructions that were given to them at the PAT appointment. Patient was also instructed that they will need to review over the PAT instructions again at home before surgery.

## 2021-10-12 NOTE — Progress Notes (Signed)
Sent message, via epic in basket, requesting orders in epic from surgeon.  

## 2021-10-12 NOTE — Patient Instructions (Addendum)
DUE TO COVID-19 ONLY ONE VISITOR IS ALLOWED TO COME WITH YOU AND STAY IN THE WAITING ROOM ONLY DURING PRE OP AND PROCEDURE.   **NO VISITORS ARE ALLOWED IN THE SHORT STAY AREA OR RECOVERY ROOM!!**  IF YOU WILL BE ADMITTED INTO THE HOSPITAL YOU ARE ALLOWED ONLY TWO SUPPORT PEOPLE DURING VISITATION HOURS ONLY (7 AM -8PM)    Up to two visitors ages 50+ are allowed at one time in a patient's room.  The visitors may rotate out with other people throughout the day.  Additionally, up to two children between the ages of 90 and 77 are allowed and do not count toward the number of allowed visitors.  Children within this age range must be accompanied by an adult visitor.  One adult visitor may remain with the patient overnight and must be in the room by 8 PM.  COVID SWAB TESTING MUST BE COMPLETED ON:  10-19-21 @ 8:00 AM  COME IN THROUGH MAIN ENTRANCE of [facility].  Take a seat in the lobby area to the right as you come in the main entrance.  Call [phone]  and give your name and let them know you are here for COVID testing  You are not required to quarantine, however you are required to wear a well-fitted mask when you are out and around people not in your household.  Hand Hygiene often Do NOT share personal items Notify your provider if you are in close contact with someone who has COVID or you develop fever 100.4 or greater, new onset of sneezing, cough, sore throat, shortness of breath or body aches.   Your procedure is scheduled on: Thursday, 10-21-21   Report to [facility] Main  Entrance     Report to admitting at 12:15 PM   Call this number if you have problems the morning of surgery [phone]   Do not eat food :After Midnight.   May have liquids until 11:30 AM day of surgery  CLEAR LIQUID DIET  Foods Allowed                                                                     Foods Excluded  Water, Black Coffee (no milk/no creamer) and tea, regular and decaf                               liquids that you cannot  Plain Jell-O in any flavor  (No red)                         see through such as: Fruit ices (not with fruit pulp)                                 milk, soups, orange juice  Iced Popsicles (No red)                                    All solid food  Apple juices Sports drinks like Gatorade (No red) Lightly seasoned clear broth or consume(fat free) Sugar    Drink 2 Ensure drinks the night before surgery, complete by 10 PM.    Complete one Ensure drink the morning of surgery 3 hours prior to scheduled surgery by 11:30 AM.     The day of surgery:  Drink ONE (1) Pre-Surgery Clear Ensure the morning of surgery. Drink in one sitting. Do not sip.  This drink was given to you during your hospital  pre-op appointment visit. Nothing else to drink after completing the Pre-Surgery Clear Ensure.          If you have questions, please contact your surgeons office.     Oral Hygiene is also important to reduce your risk of infection.                                    Remember - BRUSH YOUR TEETH THE MORNING OF SURGERY WITH YOUR REGULAR TOOTHPASTE   Do NOT smoke after Midnight   Take these medicines the morning of surgery with A SIP OF WATER:  Amlodipine, Omeprazole   Stop all vitamins and herbal supplements a week before surgery             You may not have any metal on your body including hair pins, jewelry, and body piercing             Do not wear  lotions, powders, cologne, or deodorant              Men may shave face and neck.  Do not bring valuables to the hospital. [place].   Contacts, dentures or bridgework may not be worn into surgery.   Bring small overnight bag day of surgery.  Please read over the following fact sheets you were given: IF YOU HAVE QUESTIONS ABOUT YOUR PRE OP INSTRUCTIONS PLEASE CALL [facility] - Preparing for Surgery Before surgery,  you can play an important role.  Because skin is not sterile, your skin needs to be as free of germs as possible.  You can reduce the number of germs on your skin by washing with CHG (chlorahexidine gluconate) soap before surgery.  CHG is an antiseptic cleaner which kills germs and bonds with the skin to continue killing germs even after washing. Please DO NOT use if you have an allergy to CHG or antibacterial soaps.  If your skin becomes reddened/irritated stop using the CHG and inform your nurse when you arrive at Short Stay. Do not shave (including legs and underarms) for at least 48 hours prior to the first CHG shower.  You may shave your face/neck.  Please follow these instructions carefully:  1.  Shower with CHG Soap the night before surgery and the  morning of surgery.  2.  If you choose to wash your hair, wash your hair first as usual with your normal  shampoo.  3.  After you shampoo, rinse your hair and body thoroughly to remove the shampoo.                             4.  Use CHG as you would any other liquid soap.  You can apply chg directly to the skin and wash.  Gently with a scrungie or clean washcloth.  5.  Apply the CHG Soap to your body ONLY FROM THE NECK DOWN.   Do   not use on face/ open                           Wound or open sores. Avoid contact with eyes, ears mouth and   genitals (private parts).                       Wash face,  Genitals (private parts) with your normal soap.             6.  Wash thoroughly, paying special attention to the area where your    surgery  will be performed.  7.  Thoroughly rinse your body with warm water from the neck down.  8.  DO NOT shower/wash with your normal soap after using and rinsing off the CHG Soap.                9.  Pat yourself dry with a clean towel.            10.  Wear clean pajamas.            11.  Place clean sheets on your bed the night of your first shower and do not  sleep with pets. Day of Surgery : Do not apply any  lotions/deodorants the morning of surgery.  Please wear clean clothes to the hospital/surgery center.  FAILURE TO FOLLOW THESE INSTRUCTIONS MAY RESULT IN THE CANCELLATION OF YOUR SURGERY  PATIENT SIGNATURE_________________________________  NURSE SIGNATURE__________________________________  ________________________________________________________________________

## 2021-10-14 ENCOUNTER — Other Ambulatory Visit: Payer: Self-pay

## 2021-10-14 ENCOUNTER — Encounter (HOSPITAL_COMMUNITY): Payer: Self-pay

## 2021-10-14 ENCOUNTER — Encounter (HOSPITAL_COMMUNITY)
Admission: RE | Admit: 2021-10-14 | Discharge: 2021-10-14 | Disposition: A | Discharge status: Home / Self Care | Payer: BLUE CROSS/BLUE SHIELD | Source: Ambulatory Visit | Attending: Surgery | Admitting: Surgery

## 2021-10-14 VITALS — BP 144/83 | HR 61 | Temp 97.8°F | Resp 18 | Ht 72.0 in | Wt 195.2 lb

## 2021-10-14 DIAGNOSIS — I2510 Atherosclerotic heart disease of native coronary artery without angina pectoris: Secondary | ICD-10-CM

## 2021-10-14 DIAGNOSIS — Z789 Other specified health status: Secondary | ICD-10-CM | POA: Diagnosis not present

## 2021-10-14 DIAGNOSIS — Z01818 Encounter for other preprocedural examination: Secondary | ICD-10-CM

## 2021-10-14 DIAGNOSIS — Z01812 Encounter for preprocedural laboratory examination: Secondary | ICD-10-CM | POA: Insufficient documentation

## 2021-10-14 LAB — CBC
HCT: 43.9 % (ref 39.0–52.0)
Hemoglobin: 14.7 g/dL (ref 13.0–17.0)
MCH: 31 pg (ref 26.0–34.0)
MCHC: 33.5 g/dL (ref 30.0–36.0)
MCV: 92.6 fL (ref 80.0–100.0)
Platelets: 246 10*3/uL (ref 150–400)
RBC: 4.74 MIL/uL (ref 4.22–5.81)
RDW: 14 % (ref 11.5–15.5)
WBC: 7 10*3/uL (ref 4.0–10.5)
nRBC: 0 % (ref 0.0–0.2)

## 2021-10-14 LAB — COMPREHENSIVE METABOLIC PANEL
ALT: 23 U/L (ref 0–44)
AST: 17 U/L (ref 15–41)
Albumin: 4.2 g/dL (ref 3.5–5.0)
Alkaline Phosphatase: 78 U/L (ref 38–126)
Anion gap: 8 (ref 5–15)
BUN: 16 mg/dL (ref 8–23)
CO2: 26 mmol/L (ref 22–32)
Calcium: 9.1 mg/dL (ref 8.9–10.3)
Chloride: 103 mmol/L (ref 98–111)
Creatinine, Ser: 0.84 mg/dL (ref 0.61–1.24)
GFR, Estimated: >60 mL/min (ref >60)
Glucose, Bld: 110 mg/dL — ABNORMAL HIGH (ref 70–99)
Potassium: 4 mmol/L (ref 3.5–5.1)
Sodium: 137 mmol/L (ref 135–145)
Total Bilirubin: 0.6 mg/dL (ref 0.3–1.2)
Total Protein: 7.4 g/dL (ref 6.5–8.1)

## 2021-10-19 ENCOUNTER — Other Ambulatory Visit: Payer: Self-pay

## 2021-10-19 ENCOUNTER — Encounter (HOSPITAL_COMMUNITY)
Admission: RE | Admit: 2021-10-19 | Discharge: 2021-10-19 | Disposition: A | Discharge status: Home / Self Care | Payer: BLUE CROSS/BLUE SHIELD | Source: Ambulatory Visit | Attending: Surgery | Admitting: Surgery

## 2021-10-19 DIAGNOSIS — Z01812 Encounter for preprocedural laboratory examination: Secondary | ICD-10-CM | POA: Insufficient documentation

## 2021-10-19 DIAGNOSIS — U071 COVID-19: Secondary | ICD-10-CM | POA: Diagnosis not present

## 2021-10-19 DIAGNOSIS — Z01818 Encounter for other preprocedural examination: Secondary | ICD-10-CM

## 2021-10-19 LAB — SARS CORONAVIRUS 2 (TAT 6-24 HRS): SARS Coronavirus 2: POSITIVE — AB

## 2021-11-04 NOTE — Progress Notes (Signed)
Pt  COVID + 10/19/21. No retest for surgery needed.

## 2021-11-04 NOTE — Progress Notes (Deleted)
Your procedure is scheduled on:     11/24/2021   Report to [facility] Main  Entrance   Report to admitting at   [phone]     Call this number if you have problems the morning of surgery [phone]    REMEMBER: NO  SOLID FOOD CANDY OR GUM AFTER MIDNIGHT. CLEAR LIQUIDS UNTIL  0530am        . NOTHING BY MOUTH EXCEPT CLEAR LIQUIDS UNTIL    0530am   . PLEASE FINISH ENSURE DRINK PER SURGEON ORDER  WHICH NEEDS TO BE COMPLETED AT      .  0530am     CLEAR LIQUID DIET   Foods Allowed                                                                    Coffee and tea, regular and decaf                            Fruit ices (not with fruit pulp)                                      Iced Popsicles                                    Carbonated beverages, regular and diet                                    Cranberry, grape and apple juices Sports drinks like Gatorade Lightly seasoned clear broth or consume(fat free) Sugar, honey syrup ___________________________________________________________________      BRUSH YOUR TEETH MORNING OF SURGERY AND RINSE YOUR MOUTH OUT, NO CHEWING GUM CANDY OR MINTS.     Take these medicines the morning of surgery with A SIP OF WATER:  amlodipine, omeprazole   DO NOT TAKE ANY DIABETIC MEDICATIONS DAY OF YOUR SURGERY                               You may not have any metal on your body including hair pins and              piercings  Do not wear jewelry, make-up, lotions, powders or perfumes, deodorant             Do not wear nail polish on your fingernails.  Do not shave  48 hours prior to surgery.              Men may shave face and neck.   Do not bring valuables to the hospital. [place].  Contacts, dentures or bridgework may not be worn into surgery.  Leave suitcase in the car. After surgery it may be brought to your room.     Patients discharged the day of surgery will not be allowed  to  drive home. IF YOU ARE HAVING SURGERY AND GOING HOME THE SAME DAY, YOU MUST HAVE AN ADULT TO DRIVE YOU HOME AND BE WITH YOU FOR 24 HOURS. YOU MAY GO HOME BY TAXI OR UBER OR ORTHERWISE, BUT AN ADULT MUST ACCOMPANY YOU HOME AND STAY WITH YOU FOR 24 HOURS.  Name and phone number of your driver:  Special Instructions: N/A              Please read over the following fact sheets you were given: _____________________________________________________________________  [facility] - Preparing for Surgery Before surgery, you can play an important role.  Because skin is not sterile, your skin needs to be as free of germs as possible.  You can reduce the number of germs on your skin by washing with CHG (chlorahexidine gluconate) soap before surgery.  CHG is an antiseptic cleaner which kills germs and bonds with the skin to continue killing germs even after washing. Please DO NOT use if you have an allergy to CHG or antibacterial soaps.  If your skin becomes reddened/irritated stop using the CHG and inform your nurse when you arrive at Short Stay. Do not shave (including legs and underarms) for at least 48 hours prior to the first CHG shower.  You may shave your face/neck. Please follow these instructions carefully:  1.  Shower with CHG Soap the night before surgery and the  morning of Surgery.  2.  If you choose to wash your hair, wash your hair first as usual with your  normal  shampoo.  3.  After you shampoo, rinse your hair and body thoroughly to remove the  shampoo.                           4.  Use CHG as you would any other liquid soap.  You can apply chg directly  to the skin and wash                       Gently with a scrungie or clean washcloth.  5.  Apply the CHG Soap to your body ONLY FROM THE NECK DOWN.   Do not use on face/ open                           Wound or open sores. Avoid contact with eyes, ears mouth and genitals (private parts).                       Wash face,  Genitals (private parts)  with your normal soap.             6.  Wash thoroughly, paying special attention to the area where your surgery  will be performed.  7.  Thoroughly rinse your body with warm water from the neck down.  8.  DO NOT shower/wash with your normal soap after using and rinsing off  the CHG Soap.                9.  Pat yourself dry with a clean towel.            10.  Wear clean pajamas.            11.  Place clean sheets on your bed the night of your first shower and do not  sleep with pets. Day of Surgery : Do not apply any lotions/deodorants  the morning of surgery.  Please wear clean clothes to the hospital/surgery center.  FAILURE TO FOLLOW THESE INSTRUCTIONS MAY RESULT IN THE CANCELLATION OF YOUR SURGERY PATIENT SIGNATURE_________________________________  NURSE SIGNATURE__________________________________  ________________________________________________________________________

## 2021-11-04 NOTE — Progress Notes (Addendum)
Anesthesia Review:  PCP: DR Raelene Bott  Cardiologist : none  Chest x-ray : EKG : 10/20/20 on chart , 11/09/21  Echo : Stress test: Cardiac Cath :  Activity level: can do a flight of stairs without difficulty  Sleep Study/ CPAP : none  Fasting Blood Sugar :      / Checks Blood Sugar -- times a day:   Blood Thinner/ Instructions /Last Dose: ASA / Instructions/ Last Dose :   Positive for covid on 10/19/21

## 2021-11-08 NOTE — Progress Notes (Signed)
Jesse Lamb  11/08/2021   Your procedure is scheduled on:    11/24/2021.   Report to [facility] Main  Entrance   Report to admitting at   [phone]     Call this number if you have problems the morning of surgery [phone]           REMEMBER: FOLLOW ALL YOUR BOWEL PREP INSTRUCTIONS WITH CLEAR LIQUIDS FROM YOUR SURGEON'S INSTRUCTIONS. DRINK 2 PRESURGERY ENSURE DRINKS THE NIGHT BEFORE SURGERY AT 1000 PM AND 1 PRESURGERY DRINK THE DAY OF THE PROCEDURE 3 HOURS PRIOR TO SCHEDULED SURGERY.  NOTHING BY MOUTH EXCEPT CLEAR LIQUIDS UNTIL THREE HOURS PRIOR TO SCHEDULED SURGERY. PLEASE FINISH PRESURGERY 3RD  ENSURE  DRINK PER SURGEON ORDER 3 HOURS PRIOR TO SCHEDULED SURGERY TIME WHICH NEEDS TO BE COMPLETED AT _________.     CLEAR LIQUID DIET   Foods Allowed                                                                      Coffee and tea, regular and decaf                           Plain Jell-O any favor except red or purple                                         Fruit ices (not with fruit pulp)                                      Iced Popsicles                                     Carbonated beverages, regular and diet                                    Cranberry, grape and apple juices Sports drinks like Gatorade Lightly seasoned clear broth or consume(fat free) Sugar  _____________________________________________________________________      BRUSH YOUR TEETH MORNING OF SURGERY AND RINSE YOUR MOUTH OUT, NO CHEWING GUM CANDY OR MINTS.     Take these medicines the morning of surgery with A SIP OF WATER:  amlodipine, omeprazole   DO NOT TAKE ANY DIABETIC MEDICATIONS DAY OF YOUR SURGERY                               You may not have any metal on your body including hair pins and              piercings  Do not wear jewelry, make-up, lotions, powders or perfumes, deodorant             Do not wear nail polish on your fingernails.  Do not shave  48 hours prior  to surgery.              Men may shave face and neck.   Do not bring valuables to the hospital. [place].  Contacts, dentures or bridgework may not be worn into surgery.  Leave suitcase in the car. After surgery it may be brought to your room.                 Please read over the following fact sheets you were given: _____________________________________________________________________  [facility] - Preparing for Surgery Before surgery, you can play an important role.  Because skin is not sterile, your skin needs to be as free of germs as possible.  You can reduce the number of germs on your skin by washing with CHG (chlorahexidine gluconate) soap before surgery.  CHG is an antiseptic cleaner which kills germs and bonds with the skin to continue killing germs even after washing. Please DO NOT use if you have an allergy to CHG or antibacterial soaps.  If your skin becomes reddened/irritated stop using the CHG and inform your nurse when you arrive at Short Stay. Do not shave (including legs and underarms) for at least 48 hours prior to the first CHG shower.  You may shave your face/neck. Please follow these instructions carefully:  1.  Shower with CHG Soap the night before surgery and the  morning of Surgery.  2.  If you choose to wash your hair, wash your hair first as usual with your  normal  shampoo.  3.  After you shampoo, rinse your hair and body thoroughly to remove the  shampoo.                           4.  Use CHG as you would any other liquid soap.  You can apply chg directly  to the skin and wash                       Gently with a scrungie or clean washcloth.  5.  Apply the CHG Soap to your body ONLY FROM THE NECK DOWN.   Do not use on face/ open                           Wound or open sores. Avoid contact with eyes, ears mouth and genitals (private parts).                       Wash face,  Genitals (private parts) with your normal  soap.             6.  Wash thoroughly, paying special attention to the area where your surgery  will be performed.  7.  Thoroughly rinse your body with warm water from the neck down.  8.  DO NOT shower/wash with your normal soap after using and rinsing off  the CHG Soap.                9.  Pat yourself dry with a clean towel.            10.  Wear clean pajamas.            11.  Place clean sheets on your bed the night of your first  shower and do not  sleep with pets. Day of Surgery : Do not apply any lotions/deodorants the morning of surgery.  Please wear clean clothes to the hospital/surgery center.  FAILURE TO FOLLOW THESE INSTRUCTIONS MAY RESULT IN THE CANCELLATION OF YOUR SURGERY PATIENT SIGNATURE_________________________________  NURSE SIGNATURE__________________________________  ________________________________________________________________________

## 2021-11-09 ENCOUNTER — Encounter (HOSPITAL_COMMUNITY): Payer: Self-pay

## 2021-11-09 ENCOUNTER — Encounter (HOSPITAL_COMMUNITY)
Admission: RE | Admit: 2021-11-09 | Discharge: 2021-11-09 | Disposition: A | Discharge status: Home / Self Care | Payer: BLUE CROSS/BLUE SHIELD | Source: Ambulatory Visit | Attending: Surgery | Admitting: Surgery

## 2021-11-09 ENCOUNTER — Other Ambulatory Visit: Payer: Self-pay

## 2021-11-09 VITALS — BP 138/89 | HR 62 | Temp 97.5°F | Resp 16 | Ht 72.0 in | Wt 189.0 lb

## 2021-11-09 DIAGNOSIS — I10 Essential (primary) hypertension: Secondary | ICD-10-CM | POA: Insufficient documentation

## 2021-11-09 DIAGNOSIS — Z01818 Encounter for other preprocedural examination: Secondary | ICD-10-CM | POA: Insufficient documentation

## 2021-11-09 LAB — CBC
HCT: 43.1 % (ref 39.0–52.0)
Hemoglobin: 14.7 g/dL (ref 13.0–17.0)
MCH: 31.4 pg (ref 26.0–34.0)
MCHC: 34.1 g/dL (ref 30.0–36.0)
MCV: 92.1 fL (ref 80.0–100.0)
Platelets: 269 10*3/uL (ref 150–400)
RBC: 4.68 MIL/uL (ref 4.22–5.81)
RDW: 13.7 % (ref 11.5–15.5)
WBC: 8 10*3/uL (ref 4.0–10.5)
nRBC: 0 % (ref 0.0–0.2)

## 2021-11-09 LAB — COMPREHENSIVE METABOLIC PANEL
ALT: 19 U/L (ref 0–44)
AST: 16 U/L (ref 15–41)
Albumin: 4.4 g/dL (ref 3.5–5.0)
Alkaline Phosphatase: 80 U/L (ref 38–126)
Anion gap: 8 (ref 5–15)
BUN: 16 mg/dL (ref 8–23)
CO2: 25 mmol/L (ref 22–32)
Calcium: 9.3 mg/dL (ref 8.9–10.3)
Chloride: 102 mmol/L (ref 98–111)
Creatinine, Ser: 0.68 mg/dL (ref 0.61–1.24)
GFR, Estimated: >60 mL/min (ref >60)
Glucose, Bld: 111 mg/dL — ABNORMAL HIGH (ref 70–99)
Potassium: 4 mmol/L (ref 3.5–5.1)
Sodium: 135 mmol/L (ref 135–145)
Total Bilirubin: 0.3 mg/dL (ref 0.3–1.2)
Total Protein: 7.7 g/dL (ref 6.5–8.1)

## 2021-11-23 NOTE — Anesthesia Preprocedure Evaluation (Addendum)
Anesthesia Evaluation  Patient identified by MRN, date of birth, ID band Patient awake    Reviewed: Allergy & Precautions, NPO status , Patient's Chart, lab work & pertinent test results  History of Anesthesia Complications Negative for: history of anesthetic complications  Airway Mallampati: III  TM Distance: >3 FB Neck ROM: Full    Dental  (+) Dental Advisory Given   Pulmonary former smoker,    Pulmonary exam normal        Cardiovascular hypertension, Pt. on medications Normal cardiovascular exam     Neuro/Psych negative neurological ROS  negative psych ROS   GI/Hepatic GERD  Medicated and Controlled,(+)     substance abuse (sober x 2.5 yr)  alcohol use,   Endo/Other  negative endocrine ROS  Renal/GU negative Renal ROS    Prostate cancer     Musculoskeletal negative musculoskeletal ROS (+)   Abdominal   Peds  Hematology negative hematology ROS (+)   Anesthesia Other Findings   Reproductive/Obstetrics                            Anesthesia Physical Anesthesia Plan  ASA: 2  Anesthesia Plan: General   Post-op Pain Management: Celebrex PO (pre-op)* and Tylenol PO (pre-op)*   Induction: Intravenous  PONV Risk Score and Plan: 2 and Treatment may vary due to age or medical condition, Ondansetron, Dexamethasone, Midazolam and Scopolamine patch - Pre-op  Airway Management Planned: Oral ETT  Additional Equipment: None  Intra-op Plan:   Post-operative Plan: Extubation in OR  Informed Consent: I have reviewed the patients History and Physical, chart, labs and discussed the procedure including the risks, benefits and alternatives for the proposed anesthesia with the patient or authorized representative who has indicated his/her understanding and acceptance.     Dental advisory given  Plan Discussed with: CRNA and Anesthesiologist  Anesthesia Plan Comments:         Anesthesia Quick Evaluation

## 2021-11-24 ENCOUNTER — Encounter (HOSPITAL_COMMUNITY): Payer: Self-pay | Admitting: Surgery

## 2021-11-24 ENCOUNTER — Other Ambulatory Visit: Payer: Self-pay

## 2021-11-24 ENCOUNTER — Ambulatory Visit (HOSPITAL_COMMUNITY)
Admission: RE | Admit: 2021-11-24 | Discharge: 2021-11-24 | Disposition: A | Discharge status: Home / Self Care | Payer: BLUE CROSS/BLUE SHIELD | Attending: Surgery | Admitting: Surgery

## 2021-11-24 ENCOUNTER — Ambulatory Visit (HOSPITAL_COMMUNITY): Payer: BLUE CROSS/BLUE SHIELD | Admitting: Physician Assistant

## 2021-11-24 ENCOUNTER — Encounter (HOSPITAL_COMMUNITY)
Admission: RE | Disposition: A | Discharge status: Home / Self Care | Payer: Self-pay | Source: Home / Self Care | Attending: Surgery

## 2021-11-24 ENCOUNTER — Ambulatory Visit (HOSPITAL_COMMUNITY): Payer: BLUE CROSS/BLUE SHIELD | Admitting: Anesthesiology

## 2021-11-24 DIAGNOSIS — F17210 Nicotine dependence, cigarettes, uncomplicated: Secondary | ICD-10-CM | POA: Insufficient documentation

## 2021-11-24 DIAGNOSIS — K219 Gastro-esophageal reflux disease without esophagitis: Secondary | ICD-10-CM | POA: Diagnosis not present

## 2021-11-24 DIAGNOSIS — K635 Polyp of colon: Secondary | ICD-10-CM | POA: Insufficient documentation

## 2021-11-24 DIAGNOSIS — K432 Incisional hernia without obstruction or gangrene: Secondary | ICD-10-CM | POA: Diagnosis present

## 2021-11-24 DIAGNOSIS — I10 Essential (primary) hypertension: Secondary | ICD-10-CM | POA: Insufficient documentation

## 2021-11-24 DIAGNOSIS — Z800 Family history of malignant neoplasm of digestive organs: Secondary | ICD-10-CM | POA: Insufficient documentation

## 2021-11-24 DIAGNOSIS — Z8546 Personal history of malignant neoplasm of prostate: Secondary | ICD-10-CM | POA: Diagnosis not present

## 2021-11-24 DIAGNOSIS — C61 Malignant neoplasm of prostate: Secondary | ICD-10-CM | POA: Diagnosis present

## 2021-11-24 DIAGNOSIS — K5730 Diverticulosis of large intestine without perforation or abscess without bleeding: Secondary | ICD-10-CM | POA: Insufficient documentation

## 2021-11-24 DIAGNOSIS — Z79899 Other long term (current) drug therapy: Secondary | ICD-10-CM | POA: Insufficient documentation

## 2021-11-24 HISTORY — PX: VENTRAL HERNIA REPAIR: SHX424

## 2021-11-24 SURGERY — REPAIR, HERNIA, VENTRAL, LAPAROSCOPIC
Anesthesia: General

## 2021-11-24 MED ORDER — KETAMINE HCL 50 MG/5ML IJ SOSY
PREFILLED_SYRINGE | INTRAMUSCULAR | Status: AC
  Filled 2021-11-24: qty 5

## 2021-11-24 MED ORDER — ORAL CARE MOUTH RINSE: 15.0000 mL | Freq: Once | OROMUCOSAL | Status: AC

## 2021-11-24 MED ORDER — DEXAMETHASONE SODIUM PHOSPHATE 10 MG/ML IJ SOLN
INTRAMUSCULAR | Status: AC
  Filled 2021-11-24: qty 1

## 2021-11-24 MED ORDER — BUPIVACAINE LIPOSOME 1.3 % IJ SUSP
INTRAMUSCULAR | Status: DC | PRN
  Administered 2021-11-24: 20 mL

## 2021-11-24 MED ORDER — BUPIVACAINE-EPINEPHRINE 0.25% -1:200000 IJ SOLN
INTRAMUSCULAR | Status: DC | PRN
  Administered 2021-11-24: 60 mL

## 2021-11-24 MED ORDER — CEFAZOLIN SODIUM-DEXTROSE 2-4 GM/100ML-% IV SOLN
2.0000 g | INTRAVENOUS | Status: AC
  Administered 2021-11-24: 2 g via INTRAVENOUS
  Filled 2021-11-24: qty 100

## 2021-11-24 MED ORDER — CELECOXIB 200 MG PO CAPS
200.0000 mg | ORAL_CAPSULE | Freq: Once | ORAL | Status: DC
  Filled 2021-11-24: qty 1

## 2021-11-24 MED ORDER — LIDOCAINE HCL (PF) 2 % IJ SOLN
INTRAMUSCULAR | Status: AC
  Filled 2021-11-24: qty 15

## 2021-11-24 MED ORDER — ROCURONIUM BROMIDE 10 MG/ML (PF) SYRINGE
PREFILLED_SYRINGE | INTRAVENOUS | Status: AC
  Filled 2021-11-24: qty 10

## 2021-11-24 MED ORDER — FENTANYL CITRATE (PF) 100 MCG/2ML IJ SOLN
INTRAMUSCULAR | Status: AC
  Filled 2021-11-24: qty 2

## 2021-11-24 MED ORDER — DEXAMETHASONE SODIUM PHOSPHATE 4 MG/ML IJ SOLN
INTRAMUSCULAR | Status: DC | PRN
  Administered 2021-11-24: 6 mg via INTRAVENOUS

## 2021-11-24 MED ORDER — OXYCODONE HCL 5 MG PO TABS
ORAL_TABLET | ORAL | Status: AC
  Filled 2021-11-24: qty 1

## 2021-11-24 MED ORDER — ACETAMINOPHEN 500 MG PO TABS
1000.0000 mg | ORAL_TABLET | ORAL | Status: AC
  Administered 2021-11-24: 1000 mg via ORAL

## 2021-11-24 MED ORDER — GABAPENTIN 300 MG PO CAPS
300.0000 mg | ORAL_CAPSULE | ORAL | Status: AC
  Administered 2021-11-24: 300 mg via ORAL
  Filled 2021-11-24: qty 1

## 2021-11-24 MED ORDER — ENSURE PRE-SURGERY PO LIQD
592.0000 mL | Freq: Once | ORAL | Status: DC
  Filled 2021-11-24: qty 592

## 2021-11-24 MED ORDER — ONDANSETRON HCL 4 MG/2ML IJ SOLN
INTRAMUSCULAR | Status: DC | PRN
Start: 2021-11-24 — End: 2021-11-24
  Administered 2021-11-24: 4 mg via INTRAVENOUS

## 2021-11-24 MED ORDER — CHLORHEXIDINE GLUCONATE CLOTH 2 % EX PADS: 6.0000 | MEDICATED_PAD | Freq: Once | CUTANEOUS | Status: DC

## 2021-11-24 MED ORDER — CHLORHEXIDINE GLUCONATE 0.12 % MT SOLN
15.0000 mL | Freq: Once | OROMUCOSAL | Status: AC
  Administered 2021-11-24: 15 mL via OROMUCOSAL

## 2021-11-24 MED ORDER — BUPIVACAINE LIPOSOME 1.3 % IJ SUSP
INTRAMUSCULAR | Status: AC
  Filled 2021-11-24: qty 20

## 2021-11-24 MED ORDER — CELECOXIB 200 MG PO CAPS
200.0000 mg | ORAL_CAPSULE | ORAL | Status: AC
  Administered 2021-11-24: 200 mg via ORAL

## 2021-11-24 MED ORDER — ENSURE PRE-SURGERY PO LIQD
296.0000 mL | Freq: Once | ORAL | Status: DC
  Filled 2021-11-24: qty 296

## 2021-11-24 MED ORDER — BUPIVACAINE LIPOSOME 1.3 % IJ SUSP: 20.0000 mL | Freq: Once | INTRAMUSCULAR | Status: DC

## 2021-11-24 MED ORDER — PROPOFOL 10 MG/ML IV BOLUS
INTRAVENOUS | Status: AC
  Filled 2021-11-24: qty 20

## 2021-11-24 MED ORDER — PROMETHAZINE HCL 25 MG/ML IJ SOLN: 6.2500 mg | INTRAMUSCULAR | Status: DC | PRN

## 2021-11-24 MED ORDER — LACTATED RINGERS IV SOLN: INTRAVENOUS | Status: DC

## 2021-11-24 MED ORDER — ACETAMINOPHEN 500 MG PO TABS
1000.0000 mg | ORAL_TABLET | Freq: Once | ORAL | Status: DC
  Filled 2021-11-24: qty 2

## 2021-11-24 MED ORDER — LIDOCAINE HCL (CARDIAC) PF 100 MG/5ML IV SOSY
PREFILLED_SYRINGE | INTRAVENOUS | Status: DC | PRN
Start: 2021-11-24 — End: 2021-11-24
  Administered 2021-11-24: 60 mg via INTRAVENOUS

## 2021-11-24 MED ORDER — MIDAZOLAM HCL 2 MG/2ML IJ SOLN
INTRAMUSCULAR | Status: AC
  Filled 2021-11-24: qty 2

## 2021-11-24 MED ORDER — KETAMINE HCL 10 MG/ML IJ SOLN
INTRAMUSCULAR | Status: DC | PRN
  Administered 2021-11-24: 40 mg via INTRAVENOUS
  Administered 2021-11-24: 10 mg via INTRAVENOUS

## 2021-11-24 MED ORDER — ONDANSETRON HCL 4 MG/2ML IJ SOLN
INTRAMUSCULAR | Status: AC
  Filled 2021-11-24: qty 2

## 2021-11-24 MED ORDER — SUGAMMADEX SODIUM 200 MG/2ML IV SOLN
INTRAVENOUS | Status: DC | PRN
  Administered 2021-11-24: 200 mg via INTRAVENOUS

## 2021-11-24 MED ORDER — ROCURONIUM BROMIDE 10 MG/ML (PF) SYRINGE
PREFILLED_SYRINGE | INTRAVENOUS | Status: DC | PRN
Start: 2021-11-24 — End: 2021-11-24
  Administered 2021-11-24: 30 mg via INTRAVENOUS
  Administered 2021-11-24: 10 mg via INTRAVENOUS
  Administered 2021-11-24: 60 mg via INTRAVENOUS

## 2021-11-24 MED ORDER — BUPIVACAINE-EPINEPHRINE (PF) 0.25% -1:200000 IJ SOLN
INTRAMUSCULAR | Status: AC
  Filled 2021-11-24: qty 60

## 2021-11-24 MED ORDER — FENTANYL CITRATE PF 50 MCG/ML IJ SOSY
PREFILLED_SYRINGE | INTRAMUSCULAR | Status: AC
  Filled 2021-11-24: qty 2

## 2021-11-24 MED ORDER — FENTANYL CITRATE PF 50 MCG/ML IJ SOSY
25.0000 ug | PREFILLED_SYRINGE | INTRAMUSCULAR | Status: DC | PRN
  Administered 2021-11-24: 50 ug via INTRAVENOUS

## 2021-11-24 MED ORDER — OXYCODONE HCL 5 MG PO TABS
5.0000 mg | ORAL_TABLET | Freq: Once | ORAL | Status: AC | PRN
  Administered 2021-11-24: 5 mg via ORAL

## 2021-11-24 MED ORDER — PROPOFOL 10 MG/ML IV BOLUS
INTRAVENOUS | Status: DC | PRN
  Administered 2021-11-24: 150 mg via INTRAVENOUS

## 2021-11-24 MED ORDER — OXYCODONE HCL 5 MG/5ML PO SOLN: 5.0000 mg | Freq: Once | ORAL | Status: AC | PRN

## 2021-11-24 MED ORDER — FENTANYL CITRATE (PF) 100 MCG/2ML IJ SOLN
INTRAMUSCULAR | Status: DC | PRN
  Administered 2021-11-24: 100 ug via INTRAVENOUS
  Administered 2021-11-24 (×3): 50 ug via INTRAVENOUS

## 2021-11-24 MED ORDER — EPHEDRINE SULFATE-NACL 50-0.9 MG/10ML-% IV SOSY
PREFILLED_SYRINGE | INTRAVENOUS | Status: DC | PRN
  Administered 2021-11-24: 5 mg via INTRAVENOUS

## 2021-11-24 MED ORDER — MIDAZOLAM HCL 5 MG/5ML IJ SOLN
INTRAMUSCULAR | Status: DC | PRN
  Administered 2021-11-24: 2 mg via INTRAVENOUS

## 2021-11-24 MED ORDER — TRAMADOL HCL 50 MG PO TABS: 50.0000 mg | ORAL_TABLET | Freq: Four times a day (QID) | ORAL | 0 refills | Status: DC | PRN

## 2021-11-24 MED ORDER — LIDOCAINE HCL (PF) 2 % IJ SOLN
INTRAMUSCULAR | Status: DC | PRN
  Administered 2021-11-24: 1.5 mg/kg/h via INTRADERMAL

## 2021-11-24 SURGICAL SUPPLY — 42 items
APPLIER CLIP 5 13 M/L LIGAMAX5 (MISCELLANEOUS)
BAG COUNTER SPONGE SURGICOUNT (BAG) ×2 IMPLANT
BINDER ABDOMINAL 12 ML 46-62 (SOFTGOODS) ×1 IMPLANT
CABLE HIGH FREQUENCY MONO STRZ (ELECTRODE) ×2 IMPLANT
CHLORAPREP W/TINT 26 (MISCELLANEOUS) ×2 IMPLANT
CLIP APPLIE 5 13 M/L LIGAMAX5 (MISCELLANEOUS) IMPLANT
CLSR STERI-STRIP ANTIMIC 1/2X4 (GAUZE/BANDAGES/DRESSINGS) ×1 IMPLANT
COVER SURGICAL LIGHT HANDLE (MISCELLANEOUS) ×2 IMPLANT
DEVICE SECURE STRAP 25 ABSORB (INSTRUMENTS) ×1 IMPLANT
DEVICE TROCAR PUNCTURE CLOSURE (ENDOMECHANICALS) ×2 IMPLANT
DRAPE WARM FLUID 44X44 (DRAPES) ×2 IMPLANT
DRSG TEGADERM 2-3/8X2-3/4 SM (GAUZE/BANDAGES/DRESSINGS) ×6 IMPLANT
DRSG TEGADERM 4X4.75 (GAUZE/BANDAGES/DRESSINGS) ×2 IMPLANT
ELECT REM PT RETURN 15FT ADLT (MISCELLANEOUS) ×2 IMPLANT
GAUZE SPONGE 2X2 8PLY STRL LF (GAUZE/BANDAGES/DRESSINGS) IMPLANT
GLOVE SURG NEOPR MICRO LF SZ8 (GLOVE) ×2 IMPLANT
GLOVE SURG UNDER LTX SZ8 (GLOVE) ×2 IMPLANT
GOWN STRL REUS W/TWL XL LVL3 (GOWN DISPOSABLE) ×4 IMPLANT
IRRIG SUCT STRYKERFLOW 2 WTIP (MISCELLANEOUS) ×2
IRRIGATION SUCT STRKRFLW 2 WTP (MISCELLANEOUS) IMPLANT
KIT BASIN OR (CUSTOM PROCEDURE TRAY) ×2 IMPLANT
KIT TURNOVER KIT A (KITS) IMPLANT
MARKER SKIN DUAL TIP RULER LAB (MISCELLANEOUS) ×2 IMPLANT
MESH VENTRALIGHT ST 8X10 (Mesh General) ×1 IMPLANT
NDL SPNL 22GX3.5 QUINCKE BK (NEEDLE) IMPLANT
NEEDLE SPNL 22GX3.5 QUINCKE BK (NEEDLE) ×2 IMPLANT
PAD POSITIONING PINK XL (MISCELLANEOUS) ×2 IMPLANT
PENCIL SMOKE EVACUATOR (MISCELLANEOUS) IMPLANT
SCISSORS LAP 5X35 DISP (ENDOMECHANICALS) ×2 IMPLANT
SET TUBE SMOKE EVAC HIGH FLOW (TUBING) ×2 IMPLANT
SLEEVE ADV FIXATION 5X100MM (TROCAR) ×2 IMPLANT
SPIKE FLUID TRANSFER (MISCELLANEOUS) ×2 IMPLANT
SPONGE GAUZE 2X2 STER 10/PKG (GAUZE/BANDAGES/DRESSINGS) ×1
STRIP CLOSURE SKIN 1/2X4 (GAUZE/BANDAGES/DRESSINGS) ×4 IMPLANT
SUT MNCRL AB 4-0 PS2 18 (SUTURE) ×2 IMPLANT
SUT PDS AB 1 CT1 27 (SUTURE) ×4 IMPLANT
SUT PROLENE 1 CT 1 30 (SUTURE) ×12 IMPLANT
TOWEL OR 17X26 10 PK STRL BLUE (TOWEL DISPOSABLE) ×2 IMPLANT
TRAY LAPAROSCOPIC (CUSTOM PROCEDURE TRAY) ×2 IMPLANT
TROCAR ADV FIXATION 11X100MM (TROCAR) IMPLANT
TROCAR ADV FIXATION 5X100MM (TROCAR) ×2 IMPLANT
TROCAR BLADELESS OPT 5 100 (ENDOMECHANICALS) ×2 IMPLANT

## 2021-11-24 NOTE — Anesthesia Postprocedure Evaluation (Signed)
Anesthesia Post Note  Patient: Jesse Lamb  Procedure(s) Performed: LAPAROSCOPIC VENTRAL WALL HERNIA REPAIR WITH MESH     Patient location during evaluation: PACU Anesthesia Type: General Level of consciousness: awake and alert Pain management: pain level controlled Vital Signs Assessment: post-procedure vital signs reviewed and stable Respiratory status: spontaneous breathing, nonlabored ventilation and respiratory function stable Cardiovascular status: stable and blood pressure returned to baseline Anesthetic complications: no   No notable events documented.  Last Vitals:  Vitals:   11/24/21 1145 11/24/21 1155  BP: (!) 156/96 (!) 149/87  Pulse: 64 70  Resp: 12 14  Temp: 36.6 C   SpO2: 92% 92%    Last Pain:  Vitals:   11/24/21 1155  TempSrc:   PainSc: [name]

## 2021-11-24 NOTE — Transfer of Care (Signed)
Immediate Anesthesia Transfer of Care Note  Patient: Jesse Lamb  Procedure(s) Performed: LAPAROSCOPIC VENTRAL WALL HERNIA REPAIR WITH MESH  Patient Location: PACU  Anesthesia Type:General  Level of Consciousness: awake  Airway & Oxygen Therapy: Patient Spontanous Breathing and Patient connected to face mask oxygen  Post-op Assessment: Report given to RN and Post -op Vital signs reviewed and stable  Post vital signs: Reviewed and stable  Last Vitals:  Vitals Value Taken Time  BP 173/88 11/24/21 1045  Temp    Pulse 77 11/24/21 1047  Resp 11 11/24/21 1047  SpO2 96 % 11/24/21 1047  Vitals shown include unvalidated device data.  Last Pain:  Vitals:   11/24/21 0700  TempSrc: Oral  PainSc:          Complications: No notable events documented.

## 2021-11-24 NOTE — Progress Notes (Signed)
Pt comfortable in PACU  I discussed operative findings, updated the patient's status, discussed probable steps to recovery, and gave postoperative recommendations to the patient's spouse, Tommi Rumps.   Recommendations were made.  Questions were answered.  She expressed understanding & appreciation.   Adin Hector, MD, FACS, MASCRS Esophageal, Gastrointestinal & Colorectal Surgery Robotic and Minimally Invasive Surgery  [facility], [place]  [place]. [address] [place], [place] [address] [phone] Fax [phone] Main  CONTACT INFORMATION:  Weekday (9AM-5PM): Call [facility] main office at [phone]  Weeknight (5PM-9AM) or Weekend/Holiday: Check [email or web address] (password " [email or web address]") for General Surgery [facility] coverage  (Please, do not use SecureChat as it is not reliable communication to operating surgeons for immediate patient care)

## 2021-11-24 NOTE — Discharge Instructions (Signed)
HERNIA REPAIR: POST OP INSTRUCTIONS  ######################################################################  EAT Gradually transition to a high fiber diet with a fiber supplement over the next few weeks after discharge.  Start with a pureed / full liquid diet (see below)  WALK Walk an hour a day.  Control your pain to do that.    CONTROL PAIN Control pain so that you can walk, sleep, tolerate sneezing/coughing, and go up/down stairs.  HAVE A BOWEL MOVEMENT DAILY Keep your bowels regular to avoid problems.  OK to try a laxative to override constipation.  OK to use an antidairrheal to slow down diarrhea.  Call if not better after 2 tries  CALL IF YOU HAVE PROBLEMS/CONCERNS Call if you are still struggling despite following these instructions. Call if you have concerns not answered by these instructions  ######################################################################    DIET: Follow a light bland diet & liquids the first 24 hours after arrival home, such as soup, liquids, starches, etc.  Be sure to drink plenty of fluids.  Quickly advance to a usual solid diet within a few days.  Avoid fast food or heavy meals as your are more likely to get nauseated or have irregular bowels.  A low-fat, high-fiber diet for the rest of your life is ideal.   Take your usually prescribed home medications unless otherwise directed.  PAIN CONTROL: Pain is best controlled by a usual combination of three different methods TOGETHER: Ice/Heat Over the counter pain medication Prescription pain medication Most patients will experience some swelling and bruising around the hernia(s) such as the bellybutton, groins, or old incisions.  Ice packs or heating pads (30-60 minutes up to 6 times a day) will help. Use ice for the first few days to help decrease swelling and bruising, then switch to heat to help relax tight/sore spots and speed recovery.  Some people prefer to use ice alone, heat alone, alternating  between ice & heat.  Experiment to what works for you.  Swelling and bruising can take several weeks to resolve.   It is helpful to take an over-the-counter pain medication regularly for the first few weeks.  Choose one of the following that works best for you: Naproxen (Aleve, etc)  Two 220mg tabs twice a day Ibuprofen (Advil, etc) Three 200mg tabs four times a day (every meal & bedtime) Acetaminophen (Tylenol, etc) 325-650mg four times a day (every meal & bedtime) A  prescription for pain medication should be given to you upon discharge.  Take your pain medication as prescribed.  If you are having problems/concerns with the prescription medicine (does not control pain, nausea, vomiting, rash, itching, etc), please call us [phone] to see if we need to switch you to a different pain medicine that will work better for you and/or control your side effect better. If you need a refill on your pain medication, please contact your pharmacy.  They will contact our office to request authorization. Prescriptions will not be filled after 5 pm or on week-ends.  Avoid getting constipated.  Between the surgery and the pain medications, it is common to experience some constipation.  Increasing fluid intake and taking a fiber supplement (such as Metamucil, Citrucel, FiberCon, MiraLax, etc) 1-2 times a day regularly will usually help prevent this problem from occurring.  A mild laxative (prune juice, Milk of Magnesia, MiraLax, etc) should be taken according to package directions if there are no bowel movements after 48 hours.    Wash / shower every day.  You may shower over the dressings   as they are waterproof.    It is good for closed incisions and even open wounds to be washed every day.  Shower every day.  Short baths are fine.  Wash the incisions and wounds clean with soap & water.    You may leave closed incisions open to air if it is dry.   You may cover the incision with clean gauze & replace it after  your daily shower for comfort.  TEGADERM & STERISTRIPS:  You have clear gauze band-aid dressings over your closed incision(s).  You also have skin tapes called Steristrips on your incisions.  Leave these in place.  You may trim any edges that curl up with clean scissors.  You may remove all dressings & tapes in the shower in a week.    ACTIVITIES as tolerated:   You may resume regular (light) daily activities beginning the next day--such as daily self-care, walking, climbing stairs--gradually increasing activities as tolerated.  Control your pain so that you can walk an hour a day.  If you can walk 30 minutes without difficulty, it is safe to try more intense activity such as jogging, treadmill, bicycling, low-impact aerobics, swimming, etc. Save the most intensive and strenuous activity for last such as sit-ups, heavy lifting, contact sports, etc  Refrain from any heavy lifting or straining until you are off narcotics for pain control.   DO NOT PUSH THROUGH PAIN.  Let pain be your guide: If it hurts to do something, don't do it.  Pain is your body warning you to avoid that activity for another week until the pain goes down. You may drive when you are no longer taking prescription pain medication, you can comfortably wear a seatbelt, and you can safely maneuver your car and apply brakes. You may have sexual intercourse when it is comfortable.   FOLLOW UP in our office Please call [facility] at [phone] to set up an appointment to see your surgeon in the office for a follow-up appointment approximately 2-3 weeks after your surgery. Make sure that you call for this appointment the day you arrive home to insure a convenient appointment time.  9.  If you have disability of FMLA / Family leave forms, please bring the forms to the office for processing.  (do not give to your surgeon).  WHEN TO CALL US [phone]: Poor pain control Reactions / problems with new medications (rash/itching, nausea,  etc)  Fever over 101.5 F (38.5 C) Inability to urinate Nausea and/or vomiting Worsening swelling or bruising Continued bleeding from incision. Increased pain, redness, or drainage from the incision   The clinic staff is available to answer your questions during regular business hours (8:30am-5pm).  Please don't hesitate to call and ask to speak to one of our nurses for clinical concerns.   If you have a medical emergency, go to the nearest emergency room or call 911.  A surgeon from [facility] is always on call at the hospitals in [place]  [facility] [address], [address], [place], [place]  [address] ?  [address] [address], [place], [place]   [address] MAIN: [phone] ? TOLL FREE: [phone] ? FAX: [phone] [email or web address]    

## 2021-11-24 NOTE — Op Note (Signed)
11/24/2021  PATIENT:  Jesse Lamb  66 y.o. male  Patient Care Team: Raelene Bott, MD as PCP - General (Internal Medicine) Michael Boston, MD as Consulting Physician (General Surgery) Lucas Mallow, MD as Consulting Physician (Urology) Tyler Pita, MD as Consulting Physician (Radiation Oncology)  PRE-OPERATIVE DIAGNOSIS:  INCISIONAL ABDOMINAL WALL HERNIA  POST-OPERATIVE DIAGNOSIS:  INCISIONAL ABDOMINAL WALL HERNIA  PROCEDURE:   LAPAROSCOPIC REPAIR OF ABDOMINAL HERNIA WITH MESH  (See OR Findings below) TAP BLOCK - BILATERAL  SURGEON:  Adin Hector, MD  ASSISTANT: Candida Peeling, MD, PGY4 [employer]     ANESTHESIA:     General  Regional TRANSVERSUS ABDOMINIS PLANE (TAP) nerve block for perioperative & postoperative pain control provided with liposomal bupivacaine (Experel) mixed with 0.25% bupivacaine as a Bilateral TAP block x 40mL each side at the level of the transverse abdominis & preperitoneal spaces along the flank at the anterior axillary line, from subcostal ridge to iliac crest under laparoscopic guidance   EBL:  Total I/O In: 700 [I.V.:600; IV Piggyback:100] Out: -   Per anesthesia record  Delay start of Pharmacological VTE agent (>24hrs) due to surgical blood loss or risk of bleeding:  no  DRAINS: none   SPECIMEN:  No Specimen  DISPOSITION OF SPECIMEN:  N/A  COUNTS:  YES  PLAN OF CARE: Discharge to home after PACU  PATIENT DISPOSITION:  PACU - hemodynamically stable.  INDICATION: Pleasant patient has developed a ventral wall incisional abdominal hernia. Recommendation was made for surgical repair  The anatomy & physiology of the abdominal wall was discussed. The pathophysiology of hernias was discussed. Natural history risks without surgery including progeressive enlargement, pain, incarceration & strangulation was discussed. Contributors to complications such as smoking, obesity, diabetes, prior surgery, etc were discussed.  I feel the  risks of no intervention will lead to serious problems that outweigh the operative risks; therefore, I recommended surgery to reduce and repair the hernia. I explained laparoscopic techniques with possible need for an open approach. I noted the probable use of mesh to patch and/or buttress the hernia repair.  Risks such as bleeding, infection, abscess, need for further treatment, heart attack, death, and other risks were discussed. I noted a good likelihood this will help address the problem. Goals of post-operative recovery were discussed as well. Possibility that this will not correct all symptoms was explained. I stressed the importance of low-impact activity, aggressive pain control, avoiding constipation, & not pushing through pain to minimize risk of post-operative chronic pain or injury. Possibility of reherniation especially with smoking, obesity, diabetes, immunosuppression, and other health conditions was discussed. We will work to minimize complications.   An educational handout further explaining the pathology & treatment options was given as well. Questions were answered. The patient expresses understanding & wishes to proceed with surgery.   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  Hernia location: Supraumbilical  Hernia type: Incisional - Reducible  Hernia size - largest dimension 9cm x 5cm  Type of repair: Laparoscopic underlay repair.  Primary repair of largest hernia   Placement of mesh: Centrally intraperitoneal with edges tucked into RECTRORECTUS & preperitoneal space  Name of mesh: Bard Ventralight dual sided (polypropylene / Seprafilm)  Size of mesh: 25x20cm  Orientation: Transverse  Mesh overlap:  5-7cm   DESCRIPTION:   Informed consent was confirmed. The patient underwent general anaesthesia without difficulty. The patient was positioned appropriately. VTE prevention in place. The  patient's  abdomen was clipped, prepped, & draped in a sterile fashion. Surgical timeout confirmed our plan.  The patient was positioned in reverse Trendelenburg. Abdominal entry was gained using optical entry technique in the left upper abdomen. Entry was clean. I induced carbon dioxide insufflation. Camera inspection revealed no injury. Extra ports were carefully placed under direct laparoscopic visualization.   I could see adhesions on the parietal peritoneum under the abdominal wall.  We did laparoscopic lysis of adhesions to expose the entire anterior abdominal wall.  I primarily used focused sharp dissection.  We freed off the falciform ligament and central peritoneum to expose the retrorectus fascia   I made sure hemostasis was good.  I mapped out the region using a needle passer.   To ensure that I would have at least 5 cm radial coverage outside of the hernia defect, I chose a 25x20cm dual sided mesh.  I placed #1 Prolene stitches around its edge about every 5 cm = 12 total.  I rolled the mesh & placed into the peritoneal cavity through the largest hernia defect.  I unrolled the mesh and positioned it appropriately.  I secured the mesh to cover up the hernia defect using a laparoscopic suture passer to pass the tails of the Prolene through the abdominal wall & tagged them with clamps for good transfascial suturing.  I started out in four corners to make sure I had the mesh centered under the hernia defect appropriately, and then proceeded to work in quadrants.    We evacuated CO2 & desufflated the abdomen.  I tied the fascial stitches down. I closed the fascial defect that I placed the mesh through using #1 PDS interrupted transverse stitches primarily.  I reinsufflated the abdomen. The mesh provided at least circumferential coverage around the entire region of hernia defects.  We secured the mesh centrally with an additional transfascial stitches x2 in & out the mesh using #1 PDS under laparoscopic  visualization to close the umbilical hernia as well..   We tacked the edges & central part of the mesh to the peritoneum/posterior rectus fascia with SecureStrap absorbable tacks.   We did reinspection. Hemostasis was good. Mesh laid well. I completed a broad field block of local anesthesia at fascial stitch sites & fascial closure areas.    Capnoperitoneum was evacuated. Ports were removed. The skin was closed with Monocryl at the port sites and Steri-Strips on the fascial stitch puncture sites.  Patient is being extubated to go to the recovery room.  I made an attempt to locate & reach the desired patient contact to discuss the patient's overall status and my recommendations.  No answer on her phone.  No answer in the waiting room.  No one is available at this time.  My plans & instructions have been written in the chart.  We will try again later.  Adin Hector, M.D., F.A.C.S. Gastrointestinal and Minimally Invasive Surgery [facility] [address] [place], [place] [address] [phone] Main / Paging  11/24/2021 10:39 AM

## 2021-11-24 NOTE — Anesthesia Procedure Notes (Signed)
Procedure Name: Intubation Date/Time: 11/24/2021 8:30 AM Performed by: Eulas Post, Klarisa Barman W, CRNA Pre-anesthesia Checklist: Patient identified, Emergency Drugs available, Suction available and Patient being monitored Patient Re-evaluated:Patient Re-evaluated prior to induction Oxygen Delivery Method: Circle system utilized Preoxygenation: Pre-oxygenation with 100% oxygen Induction Type: IV induction Ventilation: Mask ventilation without difficulty Laryngoscope Size: Miller and 2 Grade View: Grade I Tube type: Oral Number of attempts: 1 Airway Equipment and Method: Stylet Placement Confirmation: ETT inserted through vocal cords under direct vision, positive ETCO2 and breath sounds checked- equal and bilateral Secured at: 23 cm Tube secured with: Tape Dental Injury: Teeth and Oropharynx as per pre-operative assessment

## 2021-11-24 NOTE — H&P (Signed)
11/24/2021     REFERRING PHYSICIAN: [name]  Patient Care Team: [name], MD as PCP - General (Internal Medicine) [name] (Urology) [name], [name], MD as Consulting Provider (General Surgery) [name], MD (Radiation Oncology)  PROVIDER: Hollace Kinnier, MD  [facility] MRN: [id number] DOB: 1956-09-25 DATE OF ENCOUNTER: 07/27/2021  Subjective   Chief Complaint: Hernia   History of Present Illness: Jesse Lamb is a 66 y.o. male who is seen today as an office consultation at the request of Dr. Gloriann Loan for evaluation of Hernia .   Pleasant smoking male. Here with his wife. History of prostate cancer requiring robotic prostatectomy March 2018 by Dr. Gloriann Loan. Developed incisional hernia. Saw Dr. Dema Severin with our group in 2019. It was not particularly large or bothersome so observation was recommended. He did end up having an elevated PSA concerning for recurrence so he had salvage external beam therapy in 2021.  Patient comes in with his wife today. He noted that the hernia is getting larger. He does feel gurgling with that and has to push it back in. Tries to wear a binder/belt when he is being more physically active.   He still smokes about a pack a day.   He can walk at least 20 minutes without difficulty. No exertional chest pain. No cardiac or pulmonary issues. No perioperative complications. He is not diabetic. No sleep apnea or CPAP. He is hard of hearing but no balance or other issues. Does have some hypertension and GERD controlled with medication. Followed by Dr. Heber Lawnside in [place]. Does not have a cardiologist.  Addendum patient was scheduled in January but came up COVID-positive.  Surgery delayed to this month.  Patient ready for surgery now.   Medical History: Past Medical History:  Diagnosis Date   History of cancer   There is no problem list on file for this patient.  Past Surgical History:  Procedure Laterality Date    PROSTATE SURGERY    No Known Allergies  Current Outpatient Medications on File Prior to Visit  Medication Sig Dispense Refill   amLODIPine (NORVASC) 5 MG tablet amlodipine 5 mg tablet   esomeprazole (NEXIUM) 20 MG DR capsule Take 20 mg by mouth   lisinopriL (ZESTRIL) 10 MG tablet lisinopril 10 mg tablet   No current facility-administered medications on file prior to visit.   History reviewed. No pertinent family history.   Social History   Tobacco Use  Smoking Status Current Every Day Smoker  Smokeless Tobacco Never Used    Social History   Socioeconomic History   Marital status: Married  Tobacco Use   Smoking status: Current Every Day Smoker   Smokeless tobacco: Never Used  Substance and Sexual Activity   Alcohol use: Not Currently   Drug use: Not Currently   ############################################################  Fraility Risk:  Preoperative Risks/Screening 1. Frailty Review:  Lives independently? yes Uses a mobility assist device (cane/walker/wheelchair)? no History of falls within 3 months? no Cognitive impairment/dementia? no Age > 65? no  2. Nutrition Screening: Cancer/IBD? yes Age >65? no Weight loss >10% in past 6 months? no If yes to any of the 3 above, consider Impact AR supplemental shake  3. PONV Screening: History of PONV? no Male under age of 9? no History of motion sickness? no  4. Chronic pain issues? no  5. Diabetes? no Last HgbA1c: No results found for: HGBA1C  Review of Systems: A complete review of systems (ROS) was obtained from the patient.  I have reviewed this information and discussed as appropriate with the patient. See HPI as well for other pertinent ROS.  Constitutional: No fevers, chills, sweats. Weight stable Eyes: No vision changes, No discharge HENT: No sore throats, nasal drainage Lymph: No neck swelling, No bruising easily Pulmonary: No cough, productive sputum CV: No orthopnea, PND Patient walks 30  minutes without difficulty. No exertional chest/neck/shoulder/arm pain.  GI: No personal nor family history of GI/colon cancer, inflammatory bowel disease, irritable bowel syndrome, allergy such as Celiac Sprue, dietary/dairy problems, colitis, ulcers nor gastritis. No recent sick contacts/gastroenteritis. No travel outside the country. No changes in diet.  Renal: No UTIs, No hematuria. Prostate cancer survivor. See HPI. Genital: No drainage, bleeding, masses Musculoskeletal: No severe joint pain. Good ROM major joints Skin: No sores or lesions Heme/Lymph: No easy bleeding. No swollen lymph nodes  Objective:   Vitals:  07/27/21 1050  BP: 130/82  Pulse: 82  Weight: 84.8 kg (187 lb)  Height: 182.9 cm (6')  Body mass index is 25.36 kg/m.  11/24/2021 BP 136/87    Pulse 62    Temp 97.7 F (36.5 C) (Oral)    Resp 16    Wt 85.7 kg    SpO2 96%    BMI 25.63 kg/m    PHYSICAL EXAM:  Constitutional: Not cachectic. Hygeine adequate. Vitals signs as above.  Eyes: Pupils reactive, normal extraocular movements. Sclera nonicteric Neuro: CN II-XII intact. No major focal sensory defects. No major motor deficits. Lymph: No head/neck/groin lymphadenopathy Psych: No severe agitation. No severe anxiety. Judgment & insight Adequate, Oriented x4, HENT: Normocephalic, Mucus membranes moist. No thrush. Hard of hearing Neck: Supple, No tracheal deviation. No obvious thyromegaly Chest: No pain to chest wall compression. Good respiratory excursion. No audible wheezing CV: Pulses intact. Regular rhythm. No major extremity edema  Abdomen: Thin-walled and doughy soft. Hernia: 7 x 6 cm left supraumbilical  Swelling with gurgling that reduces down to a smaller fascial defect at site of prior prostate removal.. Diastasis recti: Mild supraumbilical midline. Soft. Nondistended. Nontender. No hepatomegaly. No splenomegaly  Gen: Inguinal hernia: Not present. Inguinal lymph nodes: without lymphadenopathy.    Rectal: (Deferred)  Ext: No obvious deformity or contracture. Edema: Not present. No cyanosis Skin: No major subcutaneous nodules. Warm and dry Musculoskeletal: Severe joint rigidity not present. No obvious clubbing. No digital petechiae.   Labs, Imaging and Diagnostic Testing:  Located in [place]' section of Epic EMR chart  PRIOR NOTES   All scripts.  SURGERY NOTES:  Located in [place]' section of Epic EMR chart  PATHOLOGY:  Located in [place]' section of Epic EMR chart  Assessment and Plan:  DIAGNOSES:  Diagnoses and all orders for this visit:  Incisional hernia, without obstruction or gangrene  Tobacco abuse  Bilateral hearing loss, unspecified hearing loss type    ASSESSMENT/PLAN  Pleasant active male prostate cancer status post robotic resection by Dr. Gloriann Loan had radiation for recurrence. No evidence of any active disease.  Obvious incisional hernia at extraction site left supraumbilical region. Containing small bowel but reducible. Not horribly symptomatic but concerning.  Standard of care is to consider hernia repair. Laparoscopic underlay mesh with primary repair. Normally be an outpatient surgery but might watch him overnight given history of smoking. I noted that smoking is a relative contraindication for me on hernia repairs since the risk of recurrence and complications is 3 times higher. I strongly recommend he quit smoking to minimize hernia recurrence and other complications. This will not resolve  without the need for mesh. He understands but notes it is hard for him to do that. We will give information to help them out and see if preoperative surgical homes can help make that happen. He is not having hard indications for immediate surgery so I would rather delay until he is off tobacco for 3 weeks to minimize perioperative complications and hernia recurrence. His wife agrees. He will take it under consideration.  The anatomy &  physiology of the abdominal wall was discussed. The pathophysiology of hernias was discussed. Natural history risks without surgery including progeressive enlargement, pain, incarceration, & strangulation was discussed. Contributors to complications such as smoking, obesity, diabetes, prior surgery, etc were discussed.   I feel the risks of no intervention will lead to serious problems that outweigh the operative risks; therefore, I recommended surgery to reduce and repair the hernia. I explained laparoscopic techniques with possible need for an open approach. I noted the probable use of mesh to patch and/or buttress the hernia repair  Risks such as bleeding, infection, abscess, need for further treatment, injury to other organs, need for repair of tissues / organs, stroke, heart attack, death, and other risks were discussed. I noted a good likelihood this will help address the problem. Goals of post-operative recovery were discussed as well. Possibility that this will not correct all symptoms was explained. I stressed the importance of low-impact activity, aggressive pain control, avoiding constipation, & not pushing through pain to minimize risk of post-operative chronic pain or injury. Possibility of reherniation especially with smoking, obesity, diabetes, immunosuppression, and other health conditions was discussed. We will work to minimize complications.   An educational handout further explaining the pathology & treatment options was given as well. Questions were answered. The patient expresses understanding & wishes to proceed with surgery.  History of prostate cancer. No evidence recurrence. Defer to radiation oncology and urology.   Cleared for surgery   Adin Hector, MD, FACS, MASCRS Esophageal, Gastrointestinal & Colorectal Surgery Robotic and Minimally Invasive Surgery  [facility], [place]  [address], [place], [place] [address] [phone] Fax [phone] Main  CONTACT INFORMATION:  Weekday (9AM-5PM): Call [facility] main office at [phone]  Weeknight (5PM-9AM) or Weekend/Holiday: Check [email or web address] (password " [email or web address]") for General Surgery [facility] coverage  (Please, do not use SecureChat as it is not reliable communication to operating surgeons for immediate patient care)      11/24/2021

## 2021-11-25 ENCOUNTER — Encounter (HOSPITAL_COMMUNITY): Payer: Self-pay | Admitting: Surgery

## 2023-04-03 ENCOUNTER — Other Ambulatory Visit: Payer: Self-pay | Admitting: Urology

## 2023-04-05 ENCOUNTER — Other Ambulatory Visit (HOSPITAL_COMMUNITY): Payer: Self-pay | Admitting: Urology

## 2023-04-05 DIAGNOSIS — C61 Malignant neoplasm of prostate: Secondary | ICD-10-CM

## 2023-04-05 DIAGNOSIS — R9721 Rising PSA following treatment for malignant neoplasm of prostate: Secondary | ICD-10-CM

## 2023-04-24 ENCOUNTER — Ambulatory Visit (HOSPITAL_COMMUNITY)
Admission: RE | Admit: 2023-04-24 | Discharge: 2023-04-24 | Disposition: A | Discharge status: Home / Self Care | Payer: Medicare HMO | Source: Ambulatory Visit | Attending: Urology | Admitting: Urology

## 2023-04-24 DIAGNOSIS — C61 Malignant neoplasm of prostate: Secondary | ICD-10-CM | POA: Insufficient documentation

## 2023-04-24 DIAGNOSIS — R9721 Rising PSA following treatment for malignant neoplasm of prostate: Secondary | ICD-10-CM | POA: Insufficient documentation

## 2023-04-24 MED ORDER — PIFLIFOLASTAT F 18 (PYLARIFY) INJECTION
9.0000 | Freq: Once | INTRAVENOUS | Status: AC
  Administered 2023-04-24: 9.88 via INTRAVENOUS

## 2023-04-26 NOTE — Progress Notes (Addendum)
COVID Vaccine received:  []  No [x]  Yes Date of any COVID positive Test in last 90 days: no PCP - Lindwood Qua MD Cardiologist -   Chest x-ray -  EKG -  05/01/23 EPIC Stress Test -  ECHO -  Cardiac Cath -   Bowel Prep - [x]  No  []   Yes ______  Pacemaker / ICD device [x]  No []  Yes   Spinal Cord Stimulator:[x]  No []  Yes       History of Sleep Apnea? [x]  No []  Yes   CPAP used?- [x]  No []  Yes    Does the patient monitor blood sugar?          [x]  No []  Yes  []  N/A  Patient has: []  NO Hx DM   []  Pre-DM                 []  DM1  []   DM2 Does patient have a [employer] or Dexacom? []  No []  Yes   Fasting Blood Sugar Ranges-  Checks Blood Sugar _____ times a day  GLP1 agonist / usual dose - no GLP1 instructions:  SGLT-2 inhibitors / usual dose - no SGLT-2 instructions:   Blood Thinner / Instructions:no Aspirin Instructions:no  Comments:   Activity level: Patient is able  climb a flight of stairs without difficulty; [x]  No CP  [x]  No SOB   Patient can  perform ADLs without assistance.   Anesthesia review: HTN, Aortic atherosclerosis, L anterior fasicular block-current EKG  Patient denies shortness of breath, fever, cough and chest pain at PAT appointment.  Patient verbalized understanding and agreement to the Pre-Surgical Instructions that were given to them at this PAT appointment. Patient was also educated of the need to review these PAT instructions again prior to his/her surgery.I reviewed the appropriate phone numbers to call if they have any and questions or concerns.

## 2023-04-26 NOTE — Patient Instructions (Addendum)
SURGICAL WAITING ROOM VISITATION  Patients having surgery or a procedure may have no more than 2 support people in the waiting area - these visitors may rotate.    Children under the age of 46 must have an adult with them who is not the patient.  Due to an increase in RSV and influenza rates and associated hospitalizations, children ages 66 and under may not visit patients in [facility] hospitals.  If the patient needs to stay at the hospital during part of their recovery, the visitor guidelines for inpatient rooms apply. Pre-op nurse will coordinate an appropriate time for 1 support person to accompany patient in pre-op.  This support person may not rotate.    Please refer to the [facility] website for the visitor guidelines for Inpatients (after your surgery is over and you are in a regular room).       Your procedure is scheduled on: 05/16/23   Report to [facility] Main Entrance    Report to admitting at  10:30 AM   Call this number if you have problems the morning of surgery [phone]   Do not eat food or drink liquids :After Midnight.     Oral Hygiene is also important to reduce your risk of infection.                                    Remember - BRUSH YOUR TEETH THE MORNING OF SURGERY WITH YOUR REGULAR TOOTHPASTE   Do NOT smoke after Midnight   Take these medicines the morning of surgery with A SIP OF WATER: Amlodipine, Omeprazole             You may not have any metal on your body including hair pins, jewelry, and body piercing             Do not wear make-up, lotions, powders, perfumes/cologne, or deodorant  Do not wear nail polish including gel and S&S, artificial/acrylic nails, or any other type of covering on natural nails including finger and toenails. If you have artificial nails, gel coating, etc. that needs to be removed by a nail salon please have this removed prior to surgery or surgery may need to be canceled/ delayed if the surgeon/ anesthesia  feels like they are unable to be safely monitored.   Do not shave  48 hours prior to surgery.               Men may shave face and neck.   Do not bring valuables to the hospital. [facility] IS NOT             RESPONSIBLE   FOR VALUABLES.   Contacts, glasses, dentures or bridgework may not be worn into surgery.   Bring small overnight bag day of surgery.   DO NOT BRING YOUR HOME MEDICATIONS TO THE HOSPITAL. PHARMACY WILL DISPENSE MEDICATIONS LISTED ON YOUR MEDICATION LIST TO YOU DURING YOUR ADMISSION IN THE HOSPITAL!    Patients discharged on the day of surgery will not be allowed to drive home.  Someone NEEDS to stay with you for the first 24 hours after anesthesia.   Special Instructions: Bring a copy of your healthcare power of attorney and living will documents the day of surgery if you haven't scanned them before.              Please read over the following fact sheets you were given:  IF YOU HAVE QUESTIONS ABOUT YOUR PRE-OP INSTRUCTIONS PLEASE CALL [phone] Rosey [facility]   If you received a COVID test during your pre-op visit  it is requested that you wear a mask when out in public, stay away from anyone that may not be feeling well and notify your surgeon if you develop symptoms. If you test positive for Covid or have been in contact with anyone that has tested positive in the last 10 days please notify you surgeon.    [facility] - Preparing for Surgery Before surgery, you can play an important role.  Because skin is not sterile, your skin needs to be as free of germs as possible.  You can reduce the number of germs on your skin by washing with CHG (chlorahexidine gluconate) soap before surgery.  CHG is an antiseptic cleaner which kills germs and bonds with the skin to continue killing germs even after washing. Please DO NOT use if you have an allergy to CHG or antibacterial soaps.  If your skin becomes reddened/irritated stop using the CHG and inform your nurse when you arrive at  Short Stay. Do not shave (including legs and underarms) for at least 48 hours prior to the first CHG shower.  You may shave your face/neck.  Please follow these instructions carefully:  1.  Shower with CHG Soap the night before surgery and the  morning of surgery.  2.  If you choose to wash your hair, wash your hair first as usual with your normal  shampoo.  3.  After you shampoo, rinse your hair and body thoroughly to remove the shampoo.                             4.  Use CHG as you would any other liquid soap.  You can apply chg directly to the skin and wash.  Gently with a scrungie or clean washcloth.  5.  Apply the CHG Soap to your body ONLY FROM THE NECK DOWN.   Do   not use on face/ open                           Wound or open sores. Avoid contact with eyes, ears mouth and   genitals (private parts).                       Wash face,  Genitals (private parts) with your normal soap.             6.  Wash thoroughly, paying special attention to the area where your    surgery  will be performed.  7.  Thoroughly rinse your body with warm water from the neck down.  8.  DO NOT shower/wash with your normal soap after using and rinsing off the CHG Soap.                9.  Pat yourself dry with a clean towel.            10.  Wear clean pajamas.            11.  Place clean sheets on your bed the night of your first shower and do not  sleep with pets. Day of Surgery : Do not apply any lotions/deodorants the morning of surgery.  Please wear clean clothes to the hospital/surgery center.  FAILURE TO FOLLOW THESE INSTRUCTIONS MAY RESULT IN THE  CANCELLATION OF YOUR SURGERY  PATIENT SIGNATURE_________________________________  NURSE SIGNATURE__________________________________  ________________________________________________________________________

## 2023-05-01 ENCOUNTER — Encounter (HOSPITAL_COMMUNITY)
Admission: RE | Admit: 2023-05-01 | Discharge: 2023-05-01 | Disposition: A | Discharge status: Home / Self Care | Payer: Medicare HMO | Source: Ambulatory Visit | Attending: Urology | Admitting: Urology

## 2023-05-01 ENCOUNTER — Other Ambulatory Visit: Payer: Self-pay

## 2023-05-01 ENCOUNTER — Encounter (HOSPITAL_COMMUNITY): Payer: Self-pay

## 2023-05-01 VITALS — BP 131/78 | HR 62 | Temp 97.7°F | Resp 16 | Ht 72.0 in | Wt 182.0 lb

## 2023-05-01 DIAGNOSIS — Z01812 Encounter for preprocedural laboratory examination: Secondary | ICD-10-CM | POA: Insufficient documentation

## 2023-05-01 DIAGNOSIS — R8271 Bacteriuria: Secondary | ICD-10-CM | POA: Diagnosis present

## 2023-05-01 DIAGNOSIS — I444 Left anterior fascicular block: Secondary | ICD-10-CM | POA: Insufficient documentation

## 2023-05-01 DIAGNOSIS — I10 Essential (primary) hypertension: Secondary | ICD-10-CM | POA: Insufficient documentation

## 2023-05-01 DIAGNOSIS — Z01810 Encounter for preprocedural cardiovascular examination: Secondary | ICD-10-CM | POA: Insufficient documentation

## 2023-05-01 DIAGNOSIS — Z01818 Encounter for other preprocedural examination: Secondary | ICD-10-CM

## 2023-05-01 LAB — CBC
HCT: 46.2 % (ref 39.0–52.0)
Hemoglobin: 15.3 g/dL (ref 13.0–17.0)
MCH: 31.2 pg (ref 26.0–34.0)
MCHC: 33.1 g/dL (ref 30.0–36.0)
MCV: 94.3 fL (ref 80.0–100.0)
Platelets: 278 10*3/uL (ref 150–400)
RBC: 4.9 MIL/uL (ref 4.22–5.81)
RDW: 14.7 % (ref 11.5–15.5)
WBC: 12 10*3/uL — ABNORMAL HIGH (ref 4.0–10.5)
nRBC: 0 % (ref 0.0–0.2)

## 2023-05-01 LAB — BASIC METABOLIC PANEL
Anion gap: 7 (ref 5–15)
BUN: 12 mg/dL (ref 8–23)
CO2: 25 mmol/L (ref 22–32)
Calcium: 9.2 mg/dL (ref 8.9–10.3)
Chloride: 105 mmol/L (ref 98–111)
Creatinine, Ser: 0.7 mg/dL (ref 0.61–1.24)
GFR, Estimated: >60 mL/min (ref >60)
Glucose, Bld: 123 mg/dL — ABNORMAL HIGH (ref 70–99)
Potassium: 4.1 mmol/L (ref 3.5–5.1)
Sodium: 137 mmol/L (ref 135–145)

## 2023-05-02 LAB — URINE CULTURE: Culture: NO GROWTH

## 2023-05-16 ENCOUNTER — Ambulatory Visit (HOSPITAL_COMMUNITY): Payer: Medicare HMO | Admitting: Physician Assistant

## 2023-05-16 ENCOUNTER — Ambulatory Visit (HOSPITAL_BASED_OUTPATIENT_CLINIC_OR_DEPARTMENT_OTHER): Payer: Medicare HMO | Admitting: Physician Assistant

## 2023-05-16 ENCOUNTER — Other Ambulatory Visit: Payer: Self-pay

## 2023-05-16 ENCOUNTER — Observation Stay (HOSPITAL_COMMUNITY)
Admission: RE | Admit: 2023-05-16 | Discharge: 2023-05-17 | Disposition: A | Discharge status: Home / Self Care | Payer: Medicare HMO | Source: Ambulatory Visit | Attending: Urology | Admitting: Urology

## 2023-05-16 ENCOUNTER — Encounter (HOSPITAL_COMMUNITY): Payer: Self-pay | Admitting: Urology

## 2023-05-16 ENCOUNTER — Encounter (HOSPITAL_COMMUNITY)
Admission: RE | Disposition: A | Discharge status: Home / Self Care | Payer: Self-pay | Source: Ambulatory Visit | Attending: Urology

## 2023-05-16 DIAGNOSIS — N529 Male erectile dysfunction, unspecified: Secondary | ICD-10-CM | POA: Diagnosis not present

## 2023-05-16 DIAGNOSIS — Z87891 Personal history of nicotine dependence: Secondary | ICD-10-CM | POA: Insufficient documentation

## 2023-05-16 DIAGNOSIS — Z8546 Personal history of malignant neoplasm of prostate: Secondary | ICD-10-CM | POA: Insufficient documentation

## 2023-05-16 DIAGNOSIS — Z79899 Other long term (current) drug therapy: Secondary | ICD-10-CM | POA: Insufficient documentation

## 2023-05-16 DIAGNOSIS — I10 Essential (primary) hypertension: Secondary | ICD-10-CM | POA: Insufficient documentation

## 2023-05-16 HISTORY — PX: PENILE PROSTHESIS IMPLANT: SHX240

## 2023-05-16 SURGERY — INSERTION, PENILE PROSTHESIS, INFLATABLE
Anesthesia: General

## 2023-05-16 MED ORDER — FENTANYL CITRATE PF 50 MCG/ML IJ SOSY
25.0000 ug | PREFILLED_SYRINGE | INTRAMUSCULAR | Status: DC | PRN
  Administered 2023-05-16 (×2): 50 ug via INTRAVENOUS

## 2023-05-16 MED ORDER — VANCOMYCIN HCL 1500 MG/300ML IV SOLN
1500.0000 mg | Freq: Once | INTRAVENOUS | Status: AC
  Administered 2023-05-16: 1500 mg via INTRAVENOUS
  Filled 2023-05-16: qty 300

## 2023-05-16 MED ORDER — OXYCODONE HCL 5 MG PO TABS: 5.0000 mg | ORAL_TABLET | Freq: Once | ORAL | Status: AC | PRN

## 2023-05-16 MED ORDER — ACETAMINOPHEN 10 MG/ML IV SOLN
INTRAVENOUS | Status: AC
  Filled 2023-05-16: qty 100

## 2023-05-16 MED ORDER — ORAL CARE MOUTH RINSE: 15.0000 mL | Freq: Once | OROMUCOSAL | Status: AC

## 2023-05-16 MED ORDER — FENTANYL CITRATE (PF) 100 MCG/2ML IJ SOLN
INTRAMUSCULAR | Status: AC
  Filled 2023-05-16: qty 2

## 2023-05-16 MED ORDER — HYDROMORPHONE HCL 1 MG/ML IJ SOLN
0.2500 mg | INTRAMUSCULAR | Status: DC | PRN
  Administered 2023-05-16 (×2): 0.5 mg via INTRAVENOUS

## 2023-05-16 MED ORDER — FENTANYL CITRATE PF 50 MCG/ML IJ SOSY
PREFILLED_SYRINGE | INTRAMUSCULAR | Status: AC
  Administered 2023-05-16: 50 ug via INTRAVENOUS
  Filled 2023-05-16: qty 3

## 2023-05-16 MED ORDER — OXYCODONE HCL 5 MG PO TABS
ORAL_TABLET | ORAL | Status: AC
  Administered 2023-05-16: 5 mg via ORAL
  Filled 2023-05-16: qty 1

## 2023-05-16 MED ORDER — IRRISEPT - 450ML BOTTLE WITH 0.05% CHG IN STERILE WATER, USP 99.95% OPTIME
TOPICAL | Status: DC | PRN
  Administered 2023-05-16: 450 mL via TOPICAL

## 2023-05-16 MED ORDER — MIDAZOLAM HCL 5 MG/5ML IJ SOLN
INTRAMUSCULAR | Status: DC | PRN
  Administered 2023-05-16: 2 mg via INTRAVENOUS

## 2023-05-16 MED ORDER — VANCOMYCIN HCL 1500 MG/300ML IV SOLN
1500.0000 mg | INTRAVENOUS | Status: AC
  Administered 2023-05-16: 1500 mg via INTRAVENOUS
  Filled 2023-05-16: qty 300

## 2023-05-16 MED ORDER — SODIUM CHLORIDE 0.9 % IR SOLN
Status: DC | PRN
  Administered 2023-05-16: 1000 mL

## 2023-05-16 MED ORDER — PROPOFOL 10 MG/ML IV BOLUS
INTRAVENOUS | Status: AC
  Filled 2023-05-16: qty 20

## 2023-05-16 MED ORDER — AMLODIPINE BESYLATE 5 MG PO TABS
5.0000 mg | ORAL_TABLET | Freq: Every day | ORAL | Status: DC
  Administered 2023-05-17: 5 mg via ORAL
  Filled 2023-05-16: qty 1

## 2023-05-16 MED ORDER — LACTATED RINGERS IV SOLN: INTRAVENOUS | Status: DC

## 2023-05-16 MED ORDER — CHLORHEXIDINE GLUCONATE 4 % EX SOLN: Freq: Once | CUTANEOUS | Status: DC

## 2023-05-16 MED ORDER — HYDROMORPHONE HCL 1 MG/ML IJ SOLN
INTRAMUSCULAR | Status: AC
  Administered 2023-05-16: 0.5 mg via INTRAVENOUS
  Filled 2023-05-16: qty 1

## 2023-05-16 MED ORDER — MUPIROCIN 2 % EX OINT: 1.0000 | TOPICAL_OINTMENT | Freq: Once | CUTANEOUS | Status: DC

## 2023-05-16 MED ORDER — OXYCODONE HCL 5 MG PO TABS: 5.0000 mg | ORAL_TABLET | ORAL | Status: DC | PRN

## 2023-05-16 MED ORDER — GENTAMICIN SULFATE 40 MG/ML IJ SOLN
430.0000 mg | INTRAVENOUS | Status: AC
  Administered 2023-05-16: 430 mg via INTRAVENOUS
  Filled 2023-05-16: qty 10.75

## 2023-05-16 MED ORDER — ACETAMINOPHEN 500 MG PO TABS
1000.0000 mg | ORAL_TABLET | Freq: Three times a day (TID) | ORAL | Status: DC
  Administered 2023-05-16 – 2023-05-17 (×2): 1000 mg via ORAL
  Filled 2023-05-16 (×2): qty 2

## 2023-05-16 MED ORDER — ONDANSETRON HCL 4 MG/2ML IJ SOLN
INTRAMUSCULAR | Status: DC | PRN
Start: 2023-05-16 — End: 2023-05-16
  Administered 2023-05-16: 4 mg via INTRAVENOUS

## 2023-05-16 MED ORDER — STERILE WATER FOR IRRIGATION IR SOLN
Status: DC | PRN
  Administered 2023-05-16: 500 mL

## 2023-05-16 MED ORDER — OMEPRAZOLE 20 MG PO CPDR
20.0000 mg | DELAYED_RELEASE_CAPSULE | Freq: Every day | ORAL | Status: DC
  Administered 2023-05-17: 20 mg via ORAL
  Filled 2023-05-16: qty 1

## 2023-05-16 MED ORDER — LIDOCAINE 2% (20 MG/ML) 5 ML SYRINGE
INTRAMUSCULAR | Status: DC | PRN
  Administered 2023-05-16: 100 mg via INTRAVENOUS

## 2023-05-16 MED ORDER — DEXAMETHASONE SODIUM PHOSPHATE 10 MG/ML IJ SOLN
INTRAMUSCULAR | Status: AC
  Filled 2023-05-16: qty 1

## 2023-05-16 MED ORDER — EPHEDRINE SULFATE-NACL 50-0.9 MG/10ML-% IV SOSY
PREFILLED_SYRINGE | INTRAVENOUS | Status: DC | PRN
  Administered 2023-05-16: 10 mg via INTRAVENOUS

## 2023-05-16 MED ORDER — ONDANSETRON HCL 4 MG/2ML IJ SOLN: 4.0000 mg | INTRAMUSCULAR | Status: DC | PRN

## 2023-05-16 MED ORDER — ALBUTEROL SULFATE (2.5 MG/3ML) 0.083% IN NEBU: 3.0000 mL | INHALATION_SOLUTION | Freq: Four times a day (QID) | RESPIRATORY_TRACT | Status: DC | PRN

## 2023-05-16 MED ORDER — SODIUM CHLORIDE 0.45 % IV SOLN: INTRAVENOUS | Status: DC

## 2023-05-16 MED ORDER — BUPIVACAINE HCL (PF) 0.5 % IJ SOLN
INTRAMUSCULAR | Status: AC
  Filled 2023-05-16: qty 30

## 2023-05-16 MED ORDER — PROPOFOL 10 MG/ML IV BOLUS
INTRAVENOUS | Status: DC | PRN
Start: 2023-05-16 — End: 2023-05-16
  Administered 2023-05-16: 170 mg via INTRAVENOUS

## 2023-05-16 MED ORDER — ONDANSETRON HCL 4 MG/2ML IJ SOLN: 4.0000 mg | Freq: Once | INTRAMUSCULAR | Status: DC | PRN

## 2023-05-16 MED ORDER — NICOTINE 21 MG/24HR TD PT24
21.0000 mg | MEDICATED_PATCH | Freq: Every day | TRANSDERMAL | Status: DC
  Administered 2023-05-16 – 2023-05-17 (×2): 21 mg via TRANSDERMAL
  Filled 2023-05-16 (×2): qty 1

## 2023-05-16 MED ORDER — FENTANYL CITRATE (PF) 100 MCG/2ML IJ SOLN
INTRAMUSCULAR | Status: DC | PRN
  Administered 2023-05-16 (×4): 50 ug via INTRAVENOUS

## 2023-05-16 MED ORDER — LIDOCAINE HCL 1 % IJ SOLN
INTRAMUSCULAR | Status: DC | PRN
  Administered 2023-05-16: 8 mL

## 2023-05-16 MED ORDER — DEXAMETHASONE SODIUM PHOSPHATE 10 MG/ML IJ SOLN
INTRAMUSCULAR | Status: DC | PRN
  Administered 2023-05-16: 10 mg via INTRAVENOUS

## 2023-05-16 MED ORDER — VANCOMYCIN HCL IN DEXTROSE 1-5 GM/200ML-% IV SOLN: 1000.0000 mg | Freq: Once | INTRAVENOUS | Status: DC

## 2023-05-16 MED ORDER — LIDOCAINE HCL (PF) 1 % IJ SOLN
INTRAMUSCULAR | Status: AC
  Filled 2023-05-16: qty 30

## 2023-05-16 MED ORDER — BUPIVACAINE HCL 0.5 % IJ SOLN
INTRAMUSCULAR | Status: DC | PRN
  Administered 2023-05-16: 8 mL

## 2023-05-16 MED ORDER — CHLORHEXIDINE GLUCONATE 0.12 % MT SOLN
15.0000 mL | Freq: Once | OROMUCOSAL | Status: AC
  Administered 2023-05-16: 15 mL via OROMUCOSAL

## 2023-05-16 MED ORDER — ACETAMINOPHEN 10 MG/ML IV SOLN
1000.0000 mg | Freq: Once | INTRAVENOUS | Status: DC | PRN
  Administered 2023-05-16: 1000 mg via INTRAVENOUS

## 2023-05-16 MED ORDER — OXYCODONE HCL 5 MG/5ML PO SOLN: 5.0000 mg | Freq: Once | ORAL | Status: AC | PRN

## 2023-05-16 MED ORDER — LIDOCAINE HCL (PF) 2 % IJ SOLN
INTRAMUSCULAR | Status: AC
  Filled 2023-05-16: qty 5

## 2023-05-16 MED ORDER — FLUCONAZOLE IN SODIUM CHLORIDE 200-0.9 MG/100ML-% IV SOLN
200.0000 mg | INTRAVENOUS | Status: DC
  Administered 2023-05-16: 200 mg via INTRAVENOUS
  Filled 2023-05-16: qty 100

## 2023-05-16 MED ORDER — SENNA 8.6 MG PO TABS
1.0000 | ORAL_TABLET | Freq: Every day | ORAL | Status: DC
  Administered 2023-05-16 – 2023-05-17 (×2): 8.6 mg via ORAL
  Filled 2023-05-16 (×2): qty 1

## 2023-05-16 MED ORDER — ONDANSETRON HCL 4 MG/2ML IJ SOLN
INTRAMUSCULAR | Status: AC
  Filled 2023-05-16: qty 2

## 2023-05-16 MED ORDER — MIDAZOLAM HCL 2 MG/2ML IJ SOLN
INTRAMUSCULAR | Status: AC
  Filled 2023-05-16: qty 2

## 2023-05-16 SURGICAL SUPPLY — 53 items
ADH SKN CLS APL DERMABOND .7 (GAUZE/BANDAGES/DRESSINGS) ×1
APL PRP STRL LF DISP 70% ISPRP (MISCELLANEOUS) ×2
BAG DRN RND TRDRP ANRFLXCHMBR (UROLOGICAL SUPPLIES) ×1
BAG URINE DRAIN 2000ML AR STRL (UROLOGICAL SUPPLIES) ×1 IMPLANT
BLADE SURG 15 STRL LF DISP TIS (BLADE) IMPLANT
BLADE SURG 15 STRL SS (BLADE)
BNDG GAUZE DERMACEA FLUFF 4 (GAUZE/BANDAGES/DRESSINGS) ×1 IMPLANT
BNDG GZE DERMACEA 4 6PLY (GAUZE/BANDAGES/DRESSINGS) ×1
BRIEF MESH DISP LRG (UNDERPADS AND DIAPERS) ×1 IMPLANT
CATH COUDE 5CC RIBBED (CATHETERS) ×1 IMPLANT
CATH RIBBED COUDE 5CC (CATHETERS) ×1
CHLORAPREP W/TINT 26 (MISCELLANEOUS) ×2 IMPLANT
COVER MAYO STAND STRL (DRAPES) ×1 IMPLANT
COVER SURGICAL LIGHT HANDLE (MISCELLANEOUS) ×1 IMPLANT
DERMABOND ADVANCED .7 DNX12 (GAUZE/BANDAGES/DRESSINGS) ×1 IMPLANT
DRAIN CHANNEL 10F 3/8 F FF (DRAIN) IMPLANT
DRAPE INCISE IOBAN 66X45 STRL (DRAPES) ×1 IMPLANT
DRAPE LAPAROTOMY T 98X78 PEDS (DRAPES) ×1 IMPLANT
DRSG TEGADERM 4X4.75 (GAUZE/BANDAGES/DRESSINGS) ×1 IMPLANT
ELECT REM PT RETURN 15FT ADLT (MISCELLANEOUS) ×1 IMPLANT
EVACUATOR SILICONE 100CC (DRAIN) ×1 IMPLANT
GLOVE BIO SURGEON STRL SZ7 (GLOVE) ×1 IMPLANT
GLOVE BIOGEL PI IND STRL 7.0 (GLOVE) ×1 IMPLANT
GOWN STRL REUS W/ TWL LRG LVL3 (GOWN DISPOSABLE) ×1 IMPLANT
GOWN STRL REUS W/TWL LRG LVL3 (GOWN DISPOSABLE) ×1
HOLDER FOLEY CATH W/STRAP (MISCELLANEOUS) ×1 IMPLANT
IMPL RTE SNAPCONE 0.5CM (Urological Implant) IMPLANT
IMPLANT RTE SNAPCONE 0.5CM (Urological Implant) ×1 IMPLANT
JET LAVAGE IRRISEPT WOUND (IRRIGATION / IRRIGATOR)
KIT ACCESSORY AMS 700 PUMP (Urological Implant) IMPLANT
KIT BASIN OR (CUSTOM PROCEDURE TRAY) ×1 IMPLANT
KIT TURNOVER KIT A (KITS) IMPLANT
LAVAGE JET IRRISEPT WOUND (IRRIGATION / IRRIGATOR) IMPLANT
NDL HYPO 22X1.5 SAFETY MO (MISCELLANEOUS) ×1 IMPLANT
NEEDLE HYPO 22X1.5 SAFETY MO (MISCELLANEOUS) ×1 IMPLANT
NS IRRIG 1000ML POUR BTL (IV SOLUTION) ×1 IMPLANT
PACK GENERAL/GYN (CUSTOM PROCEDURE TRAY) ×1 IMPLANT
PLUG CATH AND CAP STRL 200 (CATHETERS) ×1 IMPLANT
PUMP PENILE AMS 700LGX MS12X18 (Erectile Restoration) IMPLANT
RESERVOIR FLAT IZ 100ML (Miscellaneous) IMPLANT
RETRACTOR WILSON SYSTEM (INSTRUMENTS) IMPLANT
SURGILUBE 2OZ TUBE FLIPTOP (MISCELLANEOUS) IMPLANT
SUT ETHILON 3 0 PS 1 (SUTURE) IMPLANT
SUT MNCRL AB 4-0 PS2 18 (SUTURE) ×1 IMPLANT
SUT VIC AB 2-0 UR6 27 (SUTURE) ×4 IMPLANT
SUT VIC AB 3-0 SH 27 (SUTURE) ×3
SUT VIC AB 3-0 SH 27X BRD (SUTURE) ×2 IMPLANT
SYR 10ML LL (SYRINGE) ×2 IMPLANT
SYR 50ML LL SCALE MARK (SYRINGE) ×3 IMPLANT
SYR CONTROL 10ML LL (SYRINGE) ×1 IMPLANT
TOWEL GREEN STERILE FF (TOWEL DISPOSABLE) ×1 IMPLANT
TOWEL OR 17X26 10 PK STRL BLUE (TOWEL DISPOSABLE) ×2 IMPLANT
WATER STERILE IRR 500ML POUR (IV SOLUTION) ×1 IMPLANT

## 2023-05-16 NOTE — H&P (Signed)
H&P  History of Present Illness: Jesse Lamb is a 67 y.o. year old M who presents today for insertion of an inflatable penile prosthesis  No acute complaints  Past Medical History:  Diagnosis Date   Aortic atherosclerosis (HCC)    Diverticulosis    Epistaxis    GERD (gastroesophageal reflux disease)    Hypertension    IBS (irritable bowel syndrome)    Inguinal hernia    small, left   Prostate CA [facility])     Past Surgical History:  Procedure Laterality Date   CATARACT EXTRACTION Left    COLONOSCOPY     LYMPHADENECTOMY Bilateral 01/01/2018   Procedure: LYMPHADENECTOMY;  Surgeon: Crista Elliot, MD;  Location: [facility] ORS;  Service: Urology;  Laterality: Bilateral;   ROBOT ASSISTED LAPAROSCOPIC RADICAL PROSTATECTOMY N/A 01/01/2018   Procedure: XI ROBOTIC ASSISTED LAPAROSCOPIC RADICAL PROSTATECTOMY;  Surgeon: Crista Elliot, MD;  Location: [facility] ORS;  Service: Urology;  Laterality: N/A;   UPPER GI ENDOSCOPY     VENTRAL HERNIA REPAIR N/A 11/24/2021   Procedure: LAPAROSCOPIC VENTRAL WALL HERNIA REPAIR WITH MESH;  Surgeon: Karie Soda, MD;  Location: [facility] ORS;  Service: General;  Laterality: N/A;   WISDOM TOOTH EXTRACTION      Home Medications:  Current Meds  Medication Sig   albuterol (VENTOLIN HFA) 108 (90 Base) MCG/ACT inhaler Inhale 1-2 puffs into the lungs every 6 (six) hours as needed for wheezing or shortness of breath.   amLODipine (NORVASC) 5 MG tablet Take 5 mg by mouth daily.   lisinopril (PRINIVIL,ZESTRIL) 10 MG tablet Take 10 mg by mouth daily.   nicotine (NICODERM CQ - DOSED IN MG/24 HOURS) 21 mg/24hr patch Place 21 mg onto the skin daily.   omeprazole (PRILOSEC OTC) 20 MG tablet Take 20 mg by mouth daily.    Allergies:  Allergies  Allergen Reactions   Statins Shortness Of Breath    Severe weakness, Shortness of breath    Family History  Problem Relation Age of Onset   Colon cancer Mother    Prostate cancer Maternal Uncle    Prostate cancer Maternal Uncle     Pancreatic cancer Neg Hx    Breast cancer Neg Hx     Social History:  reports that he quit smoking about 22 months ago. His smoking use included cigarettes. He started smoking about 45 years ago. He has a 87.5 pack-year smoking history. He has never used smokeless tobacco. He reports that he does not currently use alcohol. He reports that he does not use drugs.  ROS: A complete review of systems was performed.  All systems are negative except for pertinent findings as noted.  Physical Exam:  Vital signs in last 24 hours: Temp:  [99 F (37.2 C)] 99 F (37.2 C) (08/06 1110) Pulse Rate:  [54] 54 (08/06 1110) Resp:  [18] 18 (08/06 1110) BP: (138)/(75) 138/75 (08/06 1110) SpO2:  [95 %] 95 % (08/06 1110) Constitutional:  Alert and oriented, No acute distress Cardiovascular: Regular rate and rhythm Respiratory: Normal respiratory effort, Lungs clear bilaterally GI: Abdomen is soft, nontender, nondistended, no abdominal masses Lymphatic: No lymphadenopathy Neurologic: Grossly intact, no focal deficits Psychiatric: Normal mood and affect   Laboratory Data:  No results for input(s): "WBC", "HGB", "HCT", "PLT" in the last 72 hours.  No results for input(s): "NA", "K", "CL", "GLUCOSE", "BUN", "CALCIUM", "CREATININE" in the last 72 hours.  Invalid input(s): "CO3"   No results found for this or any previous visit (from the  past 24 hour(s)). No results found for this or any previous visit (from the past 240 hour(s)).  Renal Function: No results for input(s): "CREATININE" in the last 168 hours. CrCl cannot be calculated (Unknown ideal weight.).  Radiologic Imaging: No results found.  Assessment:  Jesse Lamb is a 67 y.o. 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 Procedure and risks reviewed, including but not limited to bleeding, infection, implant infection, implant malfunction, implant malplacement, erosion, damage to adjacent  structures, pain, urinary retention. All questions answered   Irine Seal, MD 05/16/2023, 12:32 PM  [facility] Pager: [phone]

## 2023-05-16 NOTE — Transfer of Care (Signed)
Immediate Anesthesia Transfer of Care Note  Patient: Jesse Lamb  Procedure(s) Performed: INSERTION OF INFLATABLE PENILE PROTHESIS  Patient Location: PACU  Anesthesia Type:General  Level of Consciousness: sedated  Airway & Oxygen Therapy: Patient Spontanous Breathing and Patient connected to face mask oxygen  Post-op Assessment: Report given to RN and Post -op Vital signs reviewed and stable  Post vital signs: Reviewed and stable  Last Vitals:  Vitals Value Taken Time  BP 121/84 05/16/23 1516  Temp    Pulse 71 05/16/23 1519  Resp 14 05/16/23 1519  SpO2 99 % 05/16/23 1519  Vitals shown include unfiled device data.  Last Pain:  Vitals:   05/16/23 1110  TempSrc: Oral  PainSc:       Patients Stated Pain Goal: 4 (05/16/23 1100)  Complications: No notable events documented.

## 2023-05-16 NOTE — Anesthesia Preprocedure Evaluation (Signed)
Anesthesia Evaluation  Patient identified by MRN, date of birth, ID band Patient awake    Reviewed: Allergy & Precautions, NPO status , Patient's Chart, lab work & pertinent test results  History of Anesthesia Complications Negative for: history of anesthetic complications  Airway Mallampati: III  TM Distance: >3 FB Neck ROM: Full    Dental  (+)    Pulmonary Patient abstained from smoking., former smoker   breath sounds clear to auscultation       Cardiovascular hypertension, Pt. on medications (-) angina (-) CAD, (-) Past MI, (-) Cardiac Stents, (-) CABG and (-) CHF (-) pacemaker(-) Cardiac Defibrillator  Rhythm:Regular Rate:Normal     Neuro/Psych neg Seizures    GI/Hepatic ,GERD  Medicated and Controlled,,(+) neg Cirrhosis        Endo/Other    Renal/GU Renal disease     Musculoskeletal   Abdominal   Peds  Hematology   Anesthesia Other Findings   Reproductive/Obstetrics                             Anesthesia Physical Anesthesia Plan  ASA: 2  Anesthesia Plan: General   Post-op Pain Management:    Induction: Intravenous  PONV Risk Score and Plan: 1 and Ondansetron  Airway Management Planned: LMA  Additional Equipment:   Intra-op Plan:   Post-operative Plan:   Informed Consent:      Dental advisory given  Plan Discussed with:   Anesthesia Plan Comments:        Anesthesia Quick Evaluation

## 2023-05-16 NOTE — Op Note (Signed)
PATIENT:  Jesse Lamb  PRE-OPERATIVE DIAGNOSIS:  Organic erectile dysfunction  POST-OPERATIVE DIAGNOSIS:  Same  PROCEDURE:   3 piece inflatable penile prosthesis (BS/AMS) Injection of pharmaco agent into penis  SURGEON:  Irine Seal MD  ASST: Jerald Kief, MD  INDICATION: He has had long-standing organic erectile dysfunction and refractory to other modes of treatment. He has elected to proceed with prosthesis implantation.  ANESTHESIA:  General  EBL:  Minimal  Device: 3 piece AMS CX 700: 90 cc reservoir, 18 cm cylinders and 0.5 cm rear-tip extenders on right and left sides via infrapubic approach  LOCAL MEDICATIONS USED:  10 cc lidocaine/marcaine mixture without epi  SPECIMEN: None  DISPOSITION OF SPECIMEN:  N/A  Description of procedure: The patient was taken to the major operating room, placed on the table and administered general anesthesia in the supine position. His genitalia was then prepped with chlorhexidine x 2. He was draped in the usual sterile fashion, and I used [place] on the field. An official timeout was then performed.  A butterfly needle was used to give the patient an artificial erection. There was no clinically significant curvature. 10 cc of lidocaine/marcaine mixture were also injected into the penis  A 14 French coude catheter was then placed in the bladder and the bladder was drained and the catheter was plugged. A midline infrapubic incision was then made and the dissection was carried down to the corpora. 2-0 Vicryl sutures were then placed proximally in each corpus cavernosum to serve as stay sutures. The neurovascular bundle was retracted medially with placement of the stay sutures on each side. An incision was then made in the corpus cavernosum first on the left-hand side with the bovie. Jen Mow were used to gently dilate the opening. I then dilated the corpus cavernosum with the a 11 Fr brooks dilator distally and proximally. Field goal post tests  were performed and there was no evidence of perforations or crossover. I then irrigated the corpus cavernosum with antibiotic solution and measured the distance on the left proximally and distally from the stay suture, which was found to be 7 and 11.5 cm, respectively.I then turned my attention to the contralateral corpus cavernosum and placed my stay sutures, made my corporotomy and dilated the corpus cavernosum in an identical fashion. This was measured and also was found to be 7 cm proximally and 11.5 distally. It was irrigated with anastomotic solution as was the scrotum. I then chose an 18 cm cylinder set with 0.5 cm rear-tip extenders and these were prepped while I prepared the site for reservoir placement.  I then digitally probed into the R external inguinal ring. A kelly was used to poke through the posterior wall of the ring. I used my finger to ensure I was in the appropriate space, and to clear room for the reservoir. I irrigated the space with anastomotic solution and then placed the reservoir in this location. I then filled the reservoir with 90 cc of sterile saline, and checked to confirm proper position. There was minimal backpressure with the reservoir max-filled.  Attention was redirected to the corporotomies where the cylinders were then placed by first fixing the suture to the distal aspect of the right cylinder to a straight needle. This was then loaded on the [facility] inserter and passed through the corporotomy and distally. I then advanced the straight needle with the [place]low inserter out through the glans and this was grasped with a hemostat and pulled through the glans and the suture  was secured with a hemostat. I then performed an identical maneuver on the contralateral side. After this was performed I irrigated both corpus cavernosum; there was no evidence of urethral perforation. I inserted the distal portion of the cylinder through the corporotomies and pulled this to the end of the  corpora with the suture. The proximal aspect with the rear-tip extender was then passed through the corporotomy and into the seated position on each side. I then connected reservoir tubing to a syringe filled with sterile saline and inflated the device. I noted a good straight erection with both cylinders equidistant under the glans and no buckling of the cylinders. I therefore deflated the device and closed the corporotomies with used my previously placed stay sutures.   The nasal speculum was then inserted along the left scrotum until the tips reached the dependent most point in the scrotum in the midline. The nasal speculum was spread, and used to place the pump in this location. The pump was grasped and pulled towards the patient feet, releasing the detrusor muscle. The nasal speculum was removed. The cylinder was then connected to the pump after excising the excess tubing with appropriate shodded hemostats in place with the supplied connectors to make the connection. I then again cycled the device with the pump and it cycled properly. I deflated the device. I irrigated the wound one last time with antibiotic irrigation.I placed a 10 Fr blake drain next to the pum then closed Scarpas fascia over the tubing and pump with running 3-0 monocryl suture.  A second layer was then closed over this first layer with running 3- 0 monocryl, and running skin suture w/ 4-0 monocryl performed. Incision dressed with dermabond.  A mummy wrap was applied. The catheter was removed and drain connected to suction bulb and the patient was awakened and taken recovery room in stable and satisfactory condition. He tolerated the procedure well and there were no intraoperative complications. Needle sponge and instrument counts were correct at the end of the operation.

## 2023-05-16 NOTE — Discharge Instructions (Signed)
Penile prosthesis postoperative instructions  Wound:  In most cases your incision will have absorbable sutures that will dissolve within the first 10-20 days. Some will fall out even earlier. Expect some redness as the sutures dissolved but this should occur only around the sutures. If there is generalized redness, especially with increasing pain or swelling, let us know. The scrotum and penis will very likely get "black and blue" as the blood in the tissues spread. Sometimes the whole scrotum will turn colors. The black and blue is followed by a yellow and brown color. In time, all the discoloration will go away. In some cases some firm swelling in the area of the testicle and pump may persist for up to 4-6 weeks after the surgery and is considered normal in most cases.  Drain:   You may be discharged home with a drain in place. If so, you will be taught how to empty it and should keep track of the output. Additionally, you should call the office to arrange for an appointment to have it removed after a few days.   Diet:  You may return to your normal diet within 24 hours following your surgery. You may note some mild nausea and possibly vomiting the first 6-8 hours following surgery. This is usually due to the side effects of anesthesia, and will disappear quite soon. I would suggest clear liquids and a very light meal the first evening following your surgery.  Activity:  Your physical activity should be restricted the first 48 hours. During that time you should remain relatively inactive, moving about only when necessary. During the first 3 weeks following surgery you should avoid lifting any heavy objects (anything greater than 15 pounds), and avoid strenuous exercise. If you work, ask us specifically about your restrictions, both for work and home. We will write a note to your employer if needed.  Avoid using your penis until your follow up visit with Dr , which will typically be around  3-4 weeks following the surgery. Most people are able to start cycling their device after that appointment, and can have intercourse soon thereafter.   You should plan to wear a tight pair of jockey shorts or an athletic supporter for the first 4-5 days, even to sleep. This will keep the scrotum immobilized to some degree and keep the swelling down.The position of your penis will determine what is most comfortable but I strongly urge you to keep the penis in the "up" position (toward your head). You should continue to tuck "up" your penis when possible for the first 3 months following surgery.  Ice packs should be placed on and off over the scrotum for the first 48 hours. Frozen peas or corn in a ZipLock bag can be frozen, used and re-frozen. Fifteen minutes on and 15 minutes off is a reasonable schedule. The ice is a good pain reliever and keeps the swelling down.  Hygiene:  You may shower 48 hours after your surgery. Tub bathing should be restricted until the wound is completely healed, typically around 2-3 weeks.  Medication:  You will be sent home with some type of pain medication. In many cases you will be sent home with a strong anti-inflammatory medication (Celebrex, Meloxicam) and a narcotic pain pill (hydrocodone or oxycodone). You can also supplement these medications with tylenol (acetaminophen). If the pain medication you are sent home with does not control the pain, please notify the office Problems you should report to us:  Fever of 101.0 degrees   Fahrenheit or greater. Moderate or severe swelling under the skin incision or involving the scrotum. Drug reaction such as hives, a rash, nausea or vomiting.  

## 2023-05-16 NOTE — Anesthesia Procedure Notes (Signed)
Procedure Name: LMA Insertion Date/Time: 05/16/2023 1:09 PM  Performed by: Doran Clay, CRNAPre-anesthesia Checklist: Patient identified, Emergency Drugs available, Suction available, Patient being monitored and Timeout performed Patient Re-evaluated:Patient Re-evaluated prior to induction Oxygen Delivery Method: Circle system utilized Preoxygenation: Pre-oxygenation with 100% oxygen Induction Type: IV induction LMA: LMA inserted LMA Size: 4.0 Tube type: Oral Number of attempts: 1 Placement Confirmation: positive ETCO2 and breath sounds checked- equal and bilateral Tube secured with: Tape Dental Injury: Teeth and Oropharynx as per pre-operative assessment

## 2023-05-17 ENCOUNTER — Encounter (HOSPITAL_COMMUNITY): Payer: Self-pay | Admitting: Urology

## 2023-05-17 DIAGNOSIS — N529 Male erectile dysfunction, unspecified: Secondary | ICD-10-CM | POA: Diagnosis not present

## 2023-05-17 NOTE — Anesthesia Postprocedure Evaluation (Signed)
Anesthesia Post Note  Patient: Jesse Lamb  Procedure(s) Performed: INSERTION OF INFLATABLE PENILE PROTHESIS     Patient location during evaluation: PACU Anesthesia Type: General Level of consciousness: awake and alert Pain management: pain level controlled Vital Signs Assessment: post-procedure vital signs reviewed and stable Respiratory status: spontaneous breathing, nonlabored ventilation, respiratory function stable and patient connected to nasal cannula oxygen Cardiovascular status: blood pressure returned to baseline and stable Postop Assessment: no apparent nausea or vomiting Anesthetic complications: no  No notable events documented.  Last Vitals:  Vitals:   05/17/23 0207 05/17/23 0526  BP: 127/68 (!) 141/83  Pulse: (!) 58 64  Resp: 20 20  Temp: 36.6 C 36.4 C  SpO2: 92% 93%    Last Pain:  Vitals:   05/17/23 0526  TempSrc: Oral  PainSc:                   L 

## 2023-05-17 NOTE — Discharge Summary (Signed)
Date of admission: 05/16/2023  Date of discharge: 05/17/2023  Admission diagnosis: erectile dysfunction  Discharge diagnosis: same  Secondary diagnoses:  Patient Active Problem List   Diagnosis Date Noted   Erectile dysfunction 05/16/2023   Colon polyps 11/24/2021   Diverticular disease of colon 11/24/2021   Family history of malignant neoplasm of digestive organs 11/24/2021   Irritable bowel syndrome with diarrhea 11/06/2020   Bilateral hand pain 10/06/2020   Incisional hernia 11/08/2019   Hypertension    GERD (gastroesophageal reflux disease)    Fall    Acute metabolic encephalopathy    Tobacco abuse    Alcohol abuse    Thyroid nodule    Mixed hyperlipidemia 06/01/2018   Allergy to alpha-gal 05/30/2018   Malignant neoplasm of prostate (HCC) 01/01/2018   Aortic atherosclerosis (HCC) 02/24/2017   Diverticulosis of large intestine without hemorrhage 02/24/2017    Procedures performed: Procedure(s): INSERTION OF INFLATABLE PENILE PROTHESIS  History and Physical: For full details, please see admission history and physical. Briefly, Jesse Lamb is a 67 y.o. year old patient with erectile dysfunction refractory to medication and injections who underwent IPP placement in OR.   Hospital Course: Patient tolerated the procedure well.  He was then transferred to the floor after an uneventful PACU stay.  His hospital course was uncomplicated.  On POD#1 he had met discharge criteria: was eating a regular diet, was up and ambulating independently,  pain was well controlled, was voiding without a catheter, and was ready to for discharge.  He will discharge with drain in place.   Laboratory values:  No results for input(s): "WBC", "HGB", "HCT" in the last 72 hours. No results for input(s): "NA", "K", "CL", "CO2", "GLUCOSE", "BUN", "CREATININE", "CALCIUM" in the last 72 hours. No results for input(s): "LABPT", "INR" in the last 72 hours. No results for input(s): "LABURIN" in the last 72  hours. Results for orders placed or performed during the hospital encounter of 05/01/23  Urine Culture     Status: None   Collection Time: 05/01/23  8:15 AM   Specimen: Urine, Clean Catch  Result Value Ref Range Status   Specimen Description   Final    URINE, CLEAN CATCH Performed at [facility], [address] [place], [place] [address]    Special Requests   Final    NONE Performed at [facility], [address], [place], [place] [address]    Culture   Final    NO GROWTH Performed at [facility], [address] [place], [place] [address]    Report Status 05/02/2023 FINAL  Final    Physical Exam  Gen: NAD Resp: Satting well on RA Card: Regular rate Abd: Soft, appropriately tender, ND, incision clean dry and intact GU: Voing spontaneously. Drain in place with serosanginous output, Penis and scrotum without signficant bruising. Incision clean dry and intact at dorsal base of penis.  Neuro: Alert   Disposition: Home  Discharge instruction: The patient was instructed to be ambulatory but told to refrain from heavy lifting, strenuous activity, or driving.   Discharge medications:  Allergies as of 05/17/2023       Reactions   Statins Shortness Of Breath   Severe weakness, Shortness of breath        Medication List     TAKE these medications    albuterol 108 (90 Base) MCG/ACT inhaler Commonly known as: VENTOLIN HFA Inhale 1-2 puffs into the lungs every 6 (six) hours as needed for  wheezing or shortness of breath.   amLODipine 5 MG tablet Commonly known as: NORVASC Take 5 mg by mouth daily.   lisinopril 10 MG tablet Commonly known as: ZESTRIL Take 10 mg by mouth daily.   nicotine 21 mg/24hr patch Commonly known as: NICODERM CQ - dosed in mg/24 hours Place 21 mg onto the skin daily.   omeprazole 20 MG tablet Commonly known as: PRILOSEC OTC Take 20 mg by mouth daily.        Followup:

## 2023-05-17 NOTE — Plan of Care (Signed)
?  Problem: Clinical Measurements: ?Goal: Ability to maintain clinical measurements within normal limits will improve ?Outcome: Progressing ?Goal: Will remain free from infection ?Outcome: Progressing ?Goal: Diagnostic test results will improve ?Outcome: Progressing ?  ?

## 2023-05-17 NOTE — Progress Notes (Signed)
Pt and wife given discharge teaching. Patient given instructions on how to care for JP drain on discharge.  Supplies provided for dressing changes, if needed.  Belongings returned and IV removed. Taken to front entrance in wheelchair.

## 2024-08-28 ENCOUNTER — Other Ambulatory Visit (HOSPITAL_COMMUNITY): Payer: Self-pay | Admitting: Urology

## 2024-08-28 ENCOUNTER — Encounter (HOSPITAL_COMMUNITY): Payer: Self-pay | Admitting: Urology

## 2024-08-28 DIAGNOSIS — C61 Malignant neoplasm of prostate: Secondary | ICD-10-CM

## 2024-09-09 ENCOUNTER — Encounter (HOSPITAL_COMMUNITY)
Admission: RE | Admit: 2024-09-09 | Discharge: 2024-09-09 | Disposition: A | Discharge status: Home / Self Care | Source: Ambulatory Visit | Attending: Urology | Admitting: Urology

## 2024-09-09 DIAGNOSIS — C61 Malignant neoplasm of prostate: Secondary | ICD-10-CM | POA: Insufficient documentation

## 2024-09-09 MED ORDER — FLOTUFOLASTAT F 18 GALLIUM 296-5846 MBQ/ML IV SOLN
8.1000 | Freq: Once | INTRAVENOUS | Status: AC
  Administered 2024-09-09: 8.1 via INTRAVENOUS
  Filled 2024-09-09: qty 9

## 2024-09-18 ENCOUNTER — Other Ambulatory Visit: Payer: Self-pay | Admitting: Urology

## 2024-09-18 DIAGNOSIS — C61 Malignant neoplasm of prostate: Secondary | ICD-10-CM

## 2024-09-19 ENCOUNTER — Encounter: Payer: Self-pay | Admitting: Urology

## 2024-10-07 ENCOUNTER — Ambulatory Visit

## 2024-10-07 ENCOUNTER — Ambulatory Visit: Admitting: Radiation Oncology

## 2024-10-28 NOTE — Progress Notes (Signed)
 GU Location of Tumor / Histology: Prostate Ca  If Prostate Cancer, Gleason Score is (4 + 4) and PSA is (0.9 on 08/16/2024)  Prostatectomy (01/01/2018)  PSA 0.6 on 05/16/2024  Jesse Lamb presented as referral from Jesse Lamb. [name]) elevated PSA.  Biopsies     09/09/2024 Dr. Sherwood D. Bell III NM PET ([facility]) Skull to Mid Thigh CLINICAL DATA:  Prostatectomy 01/01/2018. Recent PSA of 0.9.    IMPRESSION: 1. A focus of tracer affinity within the left paramidline region of the operative bed is indeterminate . Although this could represent posterior inferior extension of physiologic bladder uptake in the setting of prior surgery and anatomic distortion, a subtle area of local recurrence cannot be excluded. Consider pelvic MRI, with and without contrast, using a prostate protocol. 2. 2 foci of right pelvic tracer affinity, new since the prior. The more cephalad is in the typical location for presacral ganglia physiologic uptake, but is greater than typically seen and not bilateral. The more inferior is not in a typical location of physiologic ganglia uptake. 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 Consider psa PET follow up at 3 to 6 months. 3. A dominant right thyroid  mass is present and similar in morphology since 12 / 21 / 20. therefore, favored to be benign. 4. aortic atherosclerosis ([id number]). Coronary artery atherosclerosis.   Past/Anticipated interventions by urology, if any: NA  Past/Anticipated interventions by medical oncology, if any: NA  Weight changes, if any:  No  IPSS:  3 SHIM:  25  Bowel/Bladder complaints, if any:  No  Nausea/Vomiting, if any: No  Pain issues, if any:  0/10  SAFETY ISSUES: Prior radiation? Yes, prostate cancer 02/26/2020-04/20/2020. Pacemaker/ICD? No Possible current pregnancy? Male Is the patient on methotrexate? No  Current Complaints / other details: None   30 minutes  spent total, including time for meaningful use questions, reviewing medication, as well as spent in face-to-face time in nurse evaluation with the patient.

## 2024-11-06 ENCOUNTER — Ambulatory Visit
Admission: RE | Admit: 2024-11-06 | Discharge: 2024-11-06 | Disposition: A | Discharge status: Home / Self Care | Source: Ambulatory Visit | Attending: Radiation Oncology | Admitting: Radiation Oncology

## 2024-11-06 ENCOUNTER — Encounter: Payer: Self-pay | Admitting: Radiation Oncology

## 2024-11-06 VITALS — BP 159/90 | HR 65 | Temp 97.5°F | Resp 18 | Ht 72.0 in | Wt 193.8 lb

## 2024-11-06 DIAGNOSIS — K5790 Diverticulosis of intestine, part unspecified, without perforation or abscess without bleeding: Secondary | ICD-10-CM | POA: Insufficient documentation

## 2024-11-06 DIAGNOSIS — K589 Irritable bowel syndrome without diarrhea: Secondary | ICD-10-CM | POA: Insufficient documentation

## 2024-11-06 DIAGNOSIS — K219 Gastro-esophageal reflux disease without esophagitis: Secondary | ICD-10-CM | POA: Insufficient documentation

## 2024-11-06 DIAGNOSIS — C775 Secondary and unspecified malignant neoplasm of intrapelvic lymph nodes: Secondary | ICD-10-CM | POA: Insufficient documentation

## 2024-11-06 DIAGNOSIS — Z87891 Personal history of nicotine dependence: Secondary | ICD-10-CM | POA: Insufficient documentation

## 2024-11-06 DIAGNOSIS — K449 Diaphragmatic hernia without obstruction or gangrene: Secondary | ICD-10-CM | POA: Insufficient documentation

## 2024-11-06 DIAGNOSIS — I10 Essential (primary) hypertension: Secondary | ICD-10-CM | POA: Insufficient documentation

## 2024-11-06 DIAGNOSIS — R9721 Rising PSA following treatment for malignant neoplasm of prostate: Secondary | ICD-10-CM

## 2024-11-06 DIAGNOSIS — C61 Malignant neoplasm of prostate: Secondary | ICD-10-CM | POA: Insufficient documentation

## 2024-11-06 DIAGNOSIS — Z800 Family history of malignant neoplasm of digestive organs: Secondary | ICD-10-CM | POA: Insufficient documentation

## 2024-11-06 DIAGNOSIS — Z8042 Family history of malignant neoplasm of prostate: Secondary | ICD-10-CM | POA: Insufficient documentation

## 2024-11-06 DIAGNOSIS — Z79899 Other long term (current) drug therapy: Secondary | ICD-10-CM | POA: Insufficient documentation

## 2024-11-06 DIAGNOSIS — Z923 Personal history of irradiation: Secondary | ICD-10-CM | POA: Insufficient documentation

## 2024-11-06 DIAGNOSIS — I700 Atherosclerosis of aorta: Secondary | ICD-10-CM | POA: Insufficient documentation

## 2024-11-06 HISTORY — DX: Elevated prostate specific antigen (PSA): R97.20

## 2024-11-06 NOTE — Progress Notes (Signed)
 " Radiation Oncology         [phone] ________________________________  Initial Outpatient Consultation  Name: Jesse Lamb MRN: [id number]  Date: 11/06/2024  DOB: 1956-07-22  RR:Ynqqfjw, Cheryal, MD  Carolee Sherwood JONETTA DOUGLAS, MD   REFERRING PHYSICIAN: Carolee Sherwood JONETTA DOUGLAS, MD  DIAGNOSIS: 69 y.o. gentleman with a rising PSA of 0.9 s/p prostatectomy 12/2017 and salvage fossa radiation in 2021 for biochemical recurrence of Stage pT3a N0, Gleason 4+4 prostate cancer    ICD-10-CM   1. 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 Malignant neoplasm of prostate (HCC)  C61     3. 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 male with a diagnosis of oligorecurrent prostate cancer. He was initially diagnosed withGleason 4+4 prostate cancer  on TRUSPBx in 08/2017 and underwent a prostatectomy on 01/01/2018.  Pathology confirmed pT3a, N0 Gleason 4+4 prostatic adenocarcinoma with focal extraprostatic extension but negative margins and no involvement of seminal vesicles or lymph nodes. Postoperative PSA was undetectable and remained stable until 03/07/2019 when ot became low detectable at 0.02. The PSA reached 0.13 in 10/2019 and was referred to us  to discuss salvage radiation. We met with him on 10/30/19 and he elected to proceed with salvage radiation therapy to the fossa  and pelvic nodes which was completed in 04/2020.  His PSA nadired undetectable by 05/2021 following salvage radiation  but began rising again thereafter. He initially had a very gradual rising PSA at 0.029 in 05/2022, 0.038 in 08/2022, 0.054 in 10/2022 and 0.11 in 02/2023. A PSMA PET for restaging was performed at that time and was negative for any evidence of measurable disease recurance so he continued with close monitoring and follow up.   He had an inflatable penile  prosthesis placed by Dr. Lovie in 05/2023 and is very pleased. PSA has continued to rise at 0.21 in 10/2023, 0.24 in 01/2024, 0.6 in 05/2024 and 0.9 in 08/2024. A repeat PSMA PET was performed 09/09/2024 and revealed 2 small tracer avid pelvic nodes as well as some questionable activity in the paramidline fossa thought to likely represent posterior Inferior extension of physiologic bladder uptake in the setting of prior surgery and anatomic distortion, but a subtle area of local recurrence cannot be excluded. Pelvic MRI using prostate protocol was recommended but has been a challenge to schedule due to the penile prosthesis so this study remains pending.      The patient reviewed the imaging and PSA results with his urologist and he has kindly been referred today for discussion of potential radiation treatment options.   PREVIOUS RADIATION THERAPY: Yes - 02/25/20 - 04/20/20: 1. The prostate fossa and pelvic lymph nodes were initially treated to 45 Gy in 25 fractions of 1.8 Gy  2. The prostate fossa only was boosted to 68.4 Gy with 13 additional fractions of 1.8 Gy   PAST MEDICAL HISTORY:  Past Medical History:  Diagnosis Date   Aortic atherosclerosis    Diverticulosis    Elevated PSA    Epistaxis    GERD (gastroesophageal reflux disease)    Hypertension    IBS (irritable bowel syndrome)    Inguinal hernia    small, left   Prostate CA (HCC)       PAST SURGICAL HISTORY:  Past Surgical History:  Procedure Laterality Date   CATARACT EXTRACTION Left    COLONOSCOPY     LYMPHADENECTOMY Bilateral 01/01/2018   Procedure: LYMPHADENECTOMY;  Surgeon: Carolee Sherwood JONETTA DOUGLAS, MD;  Location: [facility];  Service: Urology;  Laterality: Bilateral;   PENILE PROSTHESIS IMPLANT N/A 05/16/2023   Procedure: INSERTION OF INFLATABLE PENILE PROTHESIS;  Surgeon: Lovie Arlyss CROME, MD;  Location: [facility];  Service: Urology;  Laterality: N/A;  105 MINUTES NEEDED FOR CASE   PROSTATE BIOPSY     ROBOT ASSISTED LAPAROSCOPIC  RADICAL PROSTATECTOMY N/A 01/01/2018   Procedure: XI ROBOTIC ASSISTED LAPAROSCOPIC RADICAL PROSTATECTOMY;  Surgeon: Carolee Sherwood JONETTA DOUGLAS, MD;  Location: [facility];  Service: Urology;  Laterality: N/A;   UPPER GI ENDOSCOPY     VENTRAL HERNIA REPAIR N/A 11/24/2021   Procedure: LAPAROSCOPIC VENTRAL WALL HERNIA REPAIR WITH MESH;  Surgeon: Sheldon Standing, MD;  Location: [facility];  Service: General;  Laterality: N/A;   WISDOM TOOTH EXTRACTION      FAMILY HISTORY:  Family History  Problem Relation Age of Onset   Colon cancer Mother    Prostate cancer Maternal Uncle    Prostate cancer Maternal Uncle    Pancreatic cancer Neg Hx    Breast cancer Neg Hx     SOCIAL HISTORY:  Social History   Socioeconomic History   Marital status: Married    Spouse name: Zebastian Carico   Number of children: Not on file   Years of education: Not on file   Highest education level: Not on file  Occupational History    Comment: retired stage manager of [employer]  Tobacco Use   Smoking status: Former    Current packs/day: 0.00    Average packs/day: 2.0 packs/day for 43.7 years (87.5 ttl pk-yrs)    Types: Cigarettes    Start date: 2    Quit date: 07/10/2021    Years since quitting: 3.3   Smokeless tobacco: Never  Vaping Use   Vaping status: Never Used  Substance and Sexual Activity   Alcohol use: Not Currently    Comment: stopped 2.5 years ago   Drug use: No   Sexual activity: Not Currently  Other Topics Concern   Not on file  Social History Narrative   Not on file   Social Drivers of Health   Tobacco Use: Medium Risk (11/06/2024)   Patient History    Smoking Tobacco Use: Former    Smokeless Tobacco Use: Never    Passive Exposure: Not on file  Financial Resource Strain: Low Risk (07/02/2024)   Received [facility]   Overall Financial Resource Strain (CARDIA)    How hard is it for you to pay for the very basics like food, housing, medical care, and heating?: Not hard at all  Food Insecurity: No Food  Insecurity (11/06/2024)   Epic    Worried About Radiation Protection Practitioner of Food in the Last Year: Never true    Ran Out of Food in the Last Year: Never true  Transportation Needs: No Transportation Needs (11/06/2024)   Epic    Lack of Transportation (Medical): No    Lack of Transportation (Non-Medical): No  Physical Activity: Sufficiently Active (10/02/2024)   [facility]   Exercise Vital Sign    On average, how many days per week do you engage in moderate to strenuous exercise (like a brisk walk)?: 7 days    On average, how many minutes do you engage in exercise at this level?: 60 min  Stress: Stress Concern Present (10/02/2024)   Received from [facility] - Occupational Stress Questionnaire    Do you feel stress - tense, restless, nervous, or anxious, or unable to sleep at night because your mind is troubled all the time - these days?: To some extent  Social Connections: Moderately Isolated (10/02/2024)   Received from [facility]   Social Connection and Isolation Panel    In a typical week, how many times do you talk on the phone with family, friends, or neighbors?: More than three times a week    How often do you get together with friends or relatives?: More than three times a week    How often do you attend church or religious services?: Never    Do you belong to any clubs or organizations such as church groups, unions, fraternal or athletic groups, or school groups?: No    How often do you attend meetings of the clubs or organizations you belong to?: Never    Are you married, widowed, divorced, separated, never married, or living with a partner?: Married  Intimate Partner Violence: Not At Risk (11/06/2024)   Epic    Fear of Current or Ex-Partner: No    Emotionally Abused: No    Physically Abused: No    Sexually Abused: No  Depression (PHQ2-9): Low Risk (11/06/2024)   Depression (PHQ2-9)    PHQ-2 Score: 0  Alcohol Screen:  Low Risk (11/06/2024)   Alcohol Screen    Last Alcohol Screening Score (AUDIT): 0  Housing: Low Risk (11/06/2024)   Epic    Unable to Pay for Housing in the Last Year: No    Number of Times Moved in the Last Year: 0    Homeless in the Last Year: No  Utilities: Low Risk (07/02/2024)   Received from [facility]   Utilities    Within the past 12 months, have you been unable to get utilities(heat, electricity) when it was really needed?: No  Health Literacy: Low Risk (06/29/2024)   Received from [facility]   Health Literacy    : Never    ALLERGIES: Statins, Alpha-gal, and Bovine (beef) protein-containing drug products  MEDICATIONS:  Current Outpatient Medications  Medication Sig Dispense Refill   albuterol  (VENTOLIN  HFA) 108 (90 Base) MCG/ACT inhaler Inhale 1-2 puffs into the lungs every 6 (six) hours as needed for wheezing or shortness of breath.     amLODipine  (NORVASC ) 5 MG tablet Take 5 mg by mouth daily.  0   lisinopril  (PRINIVIL ,ZESTRIL ) 10 MG tablet Take 10 mg by mouth daily.  1   mirtazapine  (REMERON ) 15 MG tablet Take 15 mg by mouth at bedtime as needed.     omeprazole  (PRILOSEC OTC) 20 MG tablet Take 20 mg by mouth daily.     No current facility-administered medications for this encounter.    REVIEW OF SYSTEMS:  On review of systems, the patient reports that he is doing well overall. He denies any chest pain, shortness of breath, cough, fevers, chills, night sweats, unintended weight changes. He denies any bowel disturbances, and denies abdominal pain, nausea or vomiting. He denies any new musculoskeletal or joint aches or pains. His IPSS was 3, indicating minmal urinary symptoms. His SHIM was 25, indicating hehas ggod sexual function utilizing the IPP. A complete review of systems is obtained and is otherwise negative.    PHYSICAL EXAM:  Wt Readings from Last 3 Encounters:  11/06/24 193 lb  12.8 oz (87.9 kg)  05/16/23 171 lb 11.8 oz (77.9 kg)  05/01/23 182 lb (82.6  kg)   Temp Readings from Last 3 Encounters:  11/06/24 (!) 97.5 F (36.4 C)  05/17/23 97.8 F (36.6 C) (Oral)  05/01/23 97.7 F (36.5 C) (Oral)   BP Readings from Last 3 Encounters:  11/06/24 (!) 159/90  05/17/23 131/82  05/01/23 131/78   Pulse Readings from Last 3 Encounters:  11/06/24 65  05/17/23 65  05/01/23 62   Pain Assessment Pain Score: 0-No pain/10  In general this is a well appearing Caucasian man in no acute distress. He's alert and oriented x4 and appropriate throughout the examination. Cardiopulmonary assessment is negative for acute distress, and he exhibits normal effort.     KPS = 100  100 - Normal; no complaints; no evidence of disease. 90   - Able to carry on normal activity; minor signs or symptoms of disease. 80   - Normal activity with effort; some signs or symptoms of disease. 22   - Cares for self; unable to carry on normal activity or to do active work. 60   - Requires occasional assistance, but is able to care for most of his personal needs. 50   - Requires considerable assistance and frequent medical care. 40   - Disabled; requires special care and assistance. 30   - Severely disabled; hospital admission is indicated although death not imminent. 20   - Very sick; hospital admission necessary; active supportive treatment necessary. 10   - Moribund; fatal processes progressing rapidly. 0     - Dead  Karnofsky DA, Abelmann WH, Craver LS and Burchenal [facility] [phone]) The use of the nitrogen mustards in the palliative treatment of carcinoma: with particular reference to bronchogenic carcinoma Cancer 1 634-56  LABORATORY DATA:  Lab Results  Component Value Date   WBC 12.0 (H) 05/01/2023   HGB 15.3 05/01/2023   HCT 46.2 05/01/2023   MCV 94.3 05/01/2023   PLT 278 05/01/2023   Lab Results  Component Value Date   NA 137 05/01/2023   K 4.1 05/01/2023   CL 105 05/01/2023   CO2 25 05/01/2023   Lab Results  Component Value Date   ALT 19 11/09/2021    AST 16 11/09/2021   ALKPHOS 80 11/09/2021   BILITOT 0.3 11/09/2021     RADIOGRAPHY: No results found.    IMPRESSION/PLAN: 1. 69 y.o. gentleman with a rising PSA of 0.9 s/p prostatectomy 12/2017 and salvage fossa radiation in 2021 for biochemical recurrence of Stage pT3a N0, Gleason 4+4 prostate cancer  Today, we reviewed the findings and workup thus far.  We discussed the natural history of oligorecurrent prostate cancer and general treatment, highlighting the role of metastasis directed radiotherapy in the management. We discussed the available radiation techniques, and focused on the details and logistics of focused stereotactic body radiotherapy (SBRT). The recommendation is for a 10 fraction course of SBRT to the PET positive nodes and nearby nodal echelons. We reviewed the anticipated acute and late sequelae associated with radiation in this setting. We also detailed the role of ADT in the treatment of oligorecurrent prostate cancer and outlined the associated side effects that could be expected with this therapy.The patient was encouraged to ask questions that were answered to his stated satisfaction.  At the conclusion of our discussion, he is in favor of proceeding with the recommended 10 fraction course of SBRT to the PET positive nodes and nearby nodal echelons without ADT. He prefers  to reserve the use of ADT for later if the PSA remains detectable or begins to rise again despite this treatment which we feel is reasonable. He has freely signed written consent to proceed today in the office and a copy of this document will be placed in his medical record. He is tentatively scheduled for CT SIM/treatment planning at 8am on 11/15/24, so we will share our discussion with Dr. Carolee, in anticipation of beginning his treatments in the near future. We enjoyed meeting with him and his wife today and look forward to continuing to participate in his care.    Sabra MICAEL Rusk, PA-C    Donnice Barge,  MD  [facility]  Radiation Oncology Direct Dial: [phone]  Fax: [phone] [email or web address]  Skype  [email or web address]   This document serves as a record of services personally performed by Donnice Barge, MD and Sabra Rusk, PA-C. It was created on their behalf by Damien Blanks, a trained medical scribe. The creation of this record is based on the scribe's personal observations and the provider's statements to them. This document has been checked and approved by the attending provider.   "

## 2024-11-14 ENCOUNTER — Encounter (HOSPITAL_COMMUNITY): Payer: Self-pay | Admitting: Radiology

## 2024-11-15 ENCOUNTER — Ambulatory Visit: Admission: RE | Admit: 2024-11-15 | Source: Ambulatory Visit | Admitting: Radiation Oncology

## 2024-11-25 ENCOUNTER — Ambulatory Visit

## 2024-11-26 ENCOUNTER — Ambulatory Visit

## 2024-11-27 ENCOUNTER — Ambulatory Visit

## 2024-11-28 ENCOUNTER — Ambulatory Visit

## 2024-11-29 ENCOUNTER — Ambulatory Visit

## 2024-12-02 ENCOUNTER — Ambulatory Visit

## 2024-12-03 ENCOUNTER — Ambulatory Visit

## 2024-12-04 ENCOUNTER — Ambulatory Visit

## 2024-12-05 ENCOUNTER — Ambulatory Visit

## 2024-12-06 ENCOUNTER — Ambulatory Visit

## 2024-12-09 ENCOUNTER — Ambulatory Visit
